# Patient Record
Sex: Female | Born: 1935 | Race: White | Hispanic: No | Smoking: Never smoker
Health system: Southern US, Community
[De-identification: ages and names within clinical notes are randomized; demographics above are authoritative.]

## PROBLEM LIST (undated history)

## (undated) DIAGNOSIS — I4729 Other ventricular tachycardia: Secondary | ICD-10-CM

## (undated) DIAGNOSIS — K219 Gastro-esophageal reflux disease without esophagitis: Secondary | ICD-10-CM

## (undated) DIAGNOSIS — R9431 Abnormal electrocardiogram [ECG] [EKG]: Secondary | ICD-10-CM

## (undated) DIAGNOSIS — I472 Ventricular tachycardia: Secondary | ICD-10-CM

## (undated) DIAGNOSIS — I4510 Unspecified right bundle-branch block: Secondary | ICD-10-CM

## (undated) DIAGNOSIS — M810 Age-related osteoporosis without current pathological fracture: Secondary | ICD-10-CM

## (undated) DIAGNOSIS — I340 Nonrheumatic mitral (valve) insufficiency: Secondary | ICD-10-CM

## (undated) DIAGNOSIS — I10 Essential (primary) hypertension: Secondary | ICD-10-CM

## (undated) DIAGNOSIS — E785 Hyperlipidemia, unspecified: Secondary | ICD-10-CM

## (undated) DIAGNOSIS — E039 Hypothyroidism, unspecified: Secondary | ICD-10-CM

## (undated) HISTORY — DX: Gastro-esophageal reflux disease without esophagitis: K21.9

## (undated) HISTORY — DX: Hypothyroidism, unspecified: E03.9

## (undated) HISTORY — DX: Hyperlipidemia, unspecified: E78.5

## (undated) HISTORY — DX: Age-related osteoporosis without current pathological fracture: M81.0

## (undated) HISTORY — DX: Essential (primary) hypertension: I10

## (undated) HISTORY — DX: Unspecified right bundle-branch block: I45.10

## (undated) HISTORY — DX: Nonrheumatic mitral (valve) insufficiency: I34.0

## (undated) HISTORY — DX: Ventricular tachycardia: I47.2

## (undated) HISTORY — DX: Other ventricular tachycardia: I47.29

## (undated) HISTORY — DX: Abnormal electrocardiogram (ECG) (EKG): R94.31

---

## 1998-10-21 ENCOUNTER — Other Ambulatory Visit: Admission: RE | Admit: 1998-10-21 | Discharge: 1998-10-21 | Payer: Self-pay | Admitting: Obstetrics and Gynecology

## 1999-10-07 ENCOUNTER — Other Ambulatory Visit: Admission: RE | Admit: 1999-10-07 | Discharge: 1999-10-07 | Payer: Self-pay | Admitting: Obstetrics & Gynecology

## 1999-12-29 ENCOUNTER — Encounter: Payer: Self-pay | Admitting: *Deleted

## 1999-12-29 ENCOUNTER — Encounter: Admission: RE | Admit: 1999-12-29 | Discharge: 1999-12-29 | Payer: Self-pay | Admitting: *Deleted

## 2000-12-08 ENCOUNTER — Other Ambulatory Visit: Admission: RE | Admit: 2000-12-08 | Discharge: 2000-12-08 | Payer: Self-pay | Admitting: Obstetrics & Gynecology

## 2001-01-01 ENCOUNTER — Encounter: Payer: Self-pay | Admitting: Internal Medicine

## 2001-01-01 ENCOUNTER — Encounter: Admission: RE | Admit: 2001-01-01 | Discharge: 2001-01-01 | Payer: Self-pay | Admitting: Internal Medicine

## 2001-04-16 ENCOUNTER — Ambulatory Visit (HOSPITAL_COMMUNITY): Admission: RE | Admit: 2001-04-16 | Discharge: 2001-04-16 | Payer: Self-pay | Admitting: Cardiology

## 2001-07-13 ENCOUNTER — Encounter: Payer: Self-pay | Admitting: Orthopedic Surgery

## 2001-07-13 ENCOUNTER — Encounter: Admission: RE | Admit: 2001-07-13 | Discharge: 2001-07-13 | Payer: Self-pay | Admitting: Orthopedic Surgery

## 2001-07-19 ENCOUNTER — Encounter: Payer: Self-pay | Admitting: Orthopedic Surgery

## 2001-07-25 ENCOUNTER — Encounter: Payer: Self-pay | Admitting: Orthopedic Surgery

## 2001-07-25 ENCOUNTER — Inpatient Hospital Stay (HOSPITAL_COMMUNITY): Admission: RE | Admit: 2001-07-25 | Discharge: 2001-07-30 | Payer: Self-pay | Admitting: Orthopedic Surgery

## 2001-12-05 ENCOUNTER — Other Ambulatory Visit: Admission: RE | Admit: 2001-12-05 | Discharge: 2001-12-05 | Payer: Self-pay | Admitting: Obstetrics & Gynecology

## 2002-01-14 ENCOUNTER — Encounter: Payer: Self-pay | Admitting: Obstetrics & Gynecology

## 2002-01-14 ENCOUNTER — Encounter: Admission: RE | Admit: 2002-01-14 | Discharge: 2002-01-14 | Payer: Self-pay | Admitting: Obstetrics & Gynecology

## 2002-05-08 ENCOUNTER — Encounter: Admission: RE | Admit: 2002-05-08 | Discharge: 2002-05-08 | Payer: Self-pay | Admitting: Orthopedic Surgery

## 2002-05-08 ENCOUNTER — Encounter: Payer: Self-pay | Admitting: Orthopedic Surgery

## 2002-07-03 ENCOUNTER — Inpatient Hospital Stay (HOSPITAL_COMMUNITY): Admission: AD | Admit: 2002-07-03 | Discharge: 2002-07-09 | Payer: Self-pay | Admitting: Cardiology

## 2002-12-05 ENCOUNTER — Other Ambulatory Visit: Admission: RE | Admit: 2002-12-05 | Discharge: 2002-12-05 | Payer: Self-pay | Admitting: Obstetrics & Gynecology

## 2002-12-19 ENCOUNTER — Other Ambulatory Visit: Admission: RE | Admit: 2002-12-19 | Discharge: 2002-12-19 | Payer: Self-pay | Admitting: Obstetrics & Gynecology

## 2003-01-16 ENCOUNTER — Encounter: Admission: RE | Admit: 2003-01-16 | Discharge: 2003-01-16 | Payer: Self-pay | Admitting: Obstetrics & Gynecology

## 2003-01-16 ENCOUNTER — Encounter: Payer: Self-pay | Admitting: Obstetrics & Gynecology

## 2004-01-29 ENCOUNTER — Encounter: Admission: RE | Admit: 2004-01-29 | Discharge: 2004-01-29 | Payer: Self-pay | Admitting: Obstetrics & Gynecology

## 2005-02-03 ENCOUNTER — Encounter: Admission: RE | Admit: 2005-02-03 | Discharge: 2005-02-03 | Payer: Self-pay | Admitting: Internal Medicine

## 2006-02-07 ENCOUNTER — Encounter: Admission: RE | Admit: 2006-02-07 | Discharge: 2006-02-07 | Payer: Self-pay | Admitting: Internal Medicine

## 2007-03-21 ENCOUNTER — Encounter: Admission: RE | Admit: 2007-03-21 | Discharge: 2007-03-21 | Payer: Self-pay | Admitting: Internal Medicine

## 2008-03-26 ENCOUNTER — Encounter: Admission: RE | Admit: 2008-03-26 | Discharge: 2008-03-26 | Payer: Self-pay | Admitting: Internal Medicine

## 2009-03-27 ENCOUNTER — Encounter: Admission: RE | Admit: 2009-03-27 | Discharge: 2009-03-27 | Payer: Self-pay | Admitting: Internal Medicine

## 2010-04-02 ENCOUNTER — Encounter: Admission: RE | Admit: 2010-04-02 | Discharge: 2010-04-02 | Payer: Self-pay | Admitting: Internal Medicine

## 2010-11-21 ENCOUNTER — Encounter: Payer: Self-pay | Admitting: Internal Medicine

## 2011-03-18 NOTE — Op Note (Signed)
[place]. [facility]  Patient:    Whitney Russell, Whitney Russell Visit Number: [id number] MRN: [id number]          Service Type: SUR Location: 5000 [id number] [id number] Attending Physician:  Twana First Dictated by:   Georgena Spurling, M.D. Proc. Date: 07/25/01 Admit Date:  07/25/2001                             Operative Report  SURGEON:  Georgena Spurling, M.D.  ASSISTANT:  Elana Alm. Thurston Hole, M.D.  ANESTHESIA:  General endotracheal.  INDICATION FOR PROCEDURE:  The patient is a 77 year old white female with a significant history of a fall and fracture with osteomyelitis and deformed distal femur approximately 40 years ago.  She had dimpling and skin adhered down to the medial aspect of the thigh all the way down to the femur.  This required consultation of a fellowship trained joint replacement surgeon (myself) and a plastic surgeon for wound closure, and therefore deserves a modified 22.  Three surgeons took part in the case, as well as one physician assistant.  DESCRIPTION OF PROCEDURE:  The patient was laid supine on the operating room table and administered general endotracheal anesthesia.  Foley catheter was placed and the right lower extremity was prepped and draped in the usual sterile fashion.  We then made an incision using a #10 blade along her old incision medially and then extended it over to the anterior surface of the knee down below the tibial tubercle.  We also extended it approximately 3 cm proximal to the old incision.  We then developed a large flap in the plane between the subcutaneous tissue and the quadriceps muscle.  We then performed a subvastus approach to the knee, where we went from the medial border of the patella subperiosteally down along the medial border of the patella to the tibial tubercle and developed a subperiosteal sleeve off of the medial crest of the tibia all the way around to the semimembranosus tendon.  Once we had freed up both  adhered skin edges off of the femur and developed flaps, we could easily evert the patella.  We measured it at 22 mm, used a 29-mm reamer to ream down to 13 mm, used a 29 template to drill three lug holes.  There was a very, very large cyst encompassing approximately 60% of the cancellous bone in the patella, which we removed at that time.  There was a good cortical shell and some lateral cancellous bone as well.  We then went into flexion and cut our ACL/PCL so we sublux it anteriorly and used our external alignment guide to make a cut taking off 2 mm off the medial compartment and this cut was 90 degrees to the long axis of the tibia.  We then turned our attention to the femur, where we made a shallow intramedullary hole, and then placed the short stubby intramedullary guide into the femur, tamped down to the distal aspect of the femur, placed our 3-degree cutting block in place, pinned through one of the 0 holes to get our depth of resection appropriate. We then removed the intramedullary guide, went into extension, used a C-arm, and used the extramedullary alignment rods to find the mechanical access from the center of the hip to the center of the ankle.  We then pinned the distal cutting jig into place at this point and went back into flexion and cut our distal femur.  We then placed a 10-mm spacer block in the extension gap, placed our external alignment guide to ensure that we were aligned from mechanical axis.  This was was in fact the case.  It did require a little bit more medial releasing of the pes anserine tendons off of the medial crest of the tibia.  We then went into flexion and marked her upper condylar access. Posterior condylar angle measured 0 degrees.  We placed the sizer block on to a size D, pins in the 3-degree external rotation slot, and then placed our 4-in-1 cutter onto the femur, screwed it into place, made our anterior, posterior, and chamfer cuts.  We then  removed the 4-in-1 cutter, placed in a lamina spreader in the lateral compartment, removed the medial meniscus, the ACL, PCL, and posterior medial osteophytes.  We then placed the lamina spreader in the medial compartment and removed the lateral meniscus and the posterior osteophytes.  We then stripped the capsule off posteriorly.  We then used the finishing block to finish our femur and condylar notch. We then turned our attention to the tibia, where we first measured our flexion-extension gaps.  A size 10 spacer worked very well in flexion and extension.  We did have maybe a 2-degree flexion contracture, which we felt was due more to the anterior bow of the femur than it was to true contracture of the soft tissue.  We then finished the tibia with a size 2 tray, drill, and keel.  We then trialed with a size 2 tibia size D femur, 10 insert, 29-mm patella.  The patella tracked excellently.  It had excellent soft tissue balancing in flexion and extension.  We then removed our components.  We did find a couple of cysts on the lateral femoral condyle and one on the lateral portion of the tibial plateau.  We debrided those with a curette, irrigated copiously, and then we cemented in the tibia first, femur second, patella third, with a trial 10-mm insert in place.  Once it had hardened in extension, we let the tourniquet down and replaced our trial with a 10-mm insert.  At this point, we closed the arthrotomy up to the superior pole of the patella and at that point, where it was adhered to bone, and at this point Dr. Etter Sjogren, plastic surgeon, came in and helped [place] develop flaps and gain closure. He will dictate this portion of the case, but basically he freed up some muscle posterior to the septum to bring anteriorly to close to the rectus femoris, and then we closed our deep soft tissues with 0 Vicryl and subcuticular 2-0 Vicryl.  He revised the scar and then we closed out with 2-0 Vicryl  and skin staples.  We dressed with Adaptic, 4 x 4s, sterile Webril, and Ace wrap.  The patient tolerated the procedure well.  COMPLICATIONS:  None.  DRAINS:  One Hemovac.   TOURNIQUET TIME:  1 hour and 53 minutes.  Again, this operation warrants a modified 22, based on the fact that it used three surgeons, including fellowship training, because of the complexity of the case due to the old deformity and the skin contracture. Dictated by:   Georgena Spurling, M.D. Attending Physician:  Twana First DD:  07/25/01 TD:  07/25/01 Job: [id number]

## 2011-03-18 NOTE — Consult Note (Signed)
NAME:  Whitney Russell, Whitney Russell                          ACCOUNT NO.:  [id number]   MEDICAL RECORD NO.:  [id number]                   PATIENT TYPE:  INP   LOCATION:  4743                                 FACILITY:  [facility]   PHYSICIAN:  Duke Salvia, M.D. [facility]           DATE OF BIRTH:  01/11/1925   DATE OF CONSULTATION:  07/03/2002  DATE OF DISCHARGE:                                   CONSULTATION   Thank you very much for asking me to see this patient in  electrophysiological consultation for wide complex tachycardia and syncope.   HISTORY OF PRESENT ILLNESS:  The patient is a 77 year old woman with a  history for about one and a half years of recurrent episodes of  lightheadedness which were subsequently correlated with nonsustained  ventricular tachycardia.  This was treated with Toprol which essentially  extinguished her symptoms.  Evaluation of her heart included an MRI that was  normal, a Cardiolite that was normal and an echo that was unrevealing.   Because of recent problems with bradycardia, her Toprol dose was decreased.  In the recent context of significant stress at home, she had an episode of  syncope that was unassociated with palpitations.  She was given an event  recorder and the next day went to the beach. The event recorder was notable  for multiple episodes of nonsustained ventricular tachycardia with cycle  length approaching 240 to 280 msec.  Notably also, the morphology of the  ventricular tachycardia was not cleanly monomorphic with some significant  changes in amplitude and morphology, although the basic impression is one of  monomorphic VT.  Because of this, she was referred to the local hospital  where she sat for eight days waiting for an electrophysiological study  following a catheterization that was normal.  However, the equipment broke  and she was then transferred back to [facility].  Her  beta-blockers were stopped in hospital in  anticipation of the procedure. She  is off them currently.   PAST MEDICAL HISTORY:  This is notable for hypertension, hypothyroidism  treated, history of GE reflux disease, history of degenerative joint disease  with a total knee replacement. She has osteopenia and history of some renal  failure post pregnancy.   PAST SURGICAL HISTORY:  This is notable for hysterectomy, knee replacement  as noted previously.   MEDICATIONS:  These include Norvasc, Levothroid 50 mcg, aspirin, Colace,  Fosamax 70 mg a week, and Toprol recently discontinued.   ALLERGIES:  She is allergic to DIAZIDE.   SOCIAL HISTORY:  She is married.  She has two children. She does not smoke  and she drinks but not to excess.   PHYSICAL EXAMINATION:  GENERAL APPEARANCE:  She is an elderly Caucasian  female in no acute distress.  VITAL SIGNS:  Blood pressure 174/72, pulse 56.  HEENT:  No scleral icterus.  No xanthomata.  NECK:  Her neck veins were flat.  Her carotids were brisk bilaterally  without bruits.  BACK:  Without kyphosis or scoliosis.  LUNGS:  Clear.  CARDIOVASCULAR:  Heart sounds were regular without murmurs or gallops. There  was an S4.  ABDOMEN:  Soft with active bowel sounds without midline pulsation or  hepatomegaly.  EXTREMITIES:  Femoral pulses were 2+ bilaterally.  Distal pulses were  intact.  There was no clubbing, cyanosis, or edema.  NEUROLOGIC:  Grossly normal examination.   Electrocardiogram from [facility] demonstrated sinus rhythm at 63  with intervals of 0.16/0.08/0.40.  The axis was normal.  Electrocardiogram  from June 26, 2002, demonstrated three PVCs that appeared to have a left  bundle branch block inferior axis morphology with a [employer] pattern.  The QRS duration in lead V1 was 110 msec.  Transtelephonic monitoring is  notable as reported previously.   IMPRESSION:  1. Wide complex ventricular tachycardia with a left bundle branch block     inferior axis  consistent with an RVOT origin that was sustained with     cycle lengths of 240 to 280 msec.  It is not clearly monomorphic though     it has an appearance suggestive of a monomorphic ventricular tachycardia.  2. Cardiac evaluations included:     a. A negative MRI at [facility].     b. A negative catheterization at [facility].     c. A negative echo, negative Cardiolite at [place].  3. Syncope and repeated presyncope associated with #1.  4. Hypertension.  5. Some degree of bradycardia on Toprol.  6. Electrocardiogram with T-wave flattening in V1 to V3 and a QRS duration     in lead V1 of 110 msec.   DISCUSSION:  The patient has ventricular tachycardia apparently originating  from the right ventricular outflow tract.  The major differential is whether  this is a rhythmogenic right ventricular dysplasia or whether it is RVOT VT.  Against the latter would include her age of onset, the fact that it is not  cleanly monomorphic, the fact that the QRS duration in V1 is somewhat long  at greater than 110 msec and the T-wave abnormalities in V1 to V3.  Against  ARVD as a diagnosis is a negative MRI and repetitive nature of her rhythm.  EP testing looking at the mechanism of the tachycardia is potentially  helpful in clarifying this.   However, from a therapeutic point of view, in the evening that it is RVOT  VT, the patient would like to undergo RF catheter ablation. She is aware of  the potential risks including cardiac perforation and risk of catastrophe in  the range of 1 in 500.  She is also aware that if in the event that the  diagnosis of ARVD is supported, that an ICD and medication, probably  Sotalol, would be the right therapeutic undertaking.   As it relates to RF catheter ablation, I have suggested that ESI mapping may  improve our success and decrease the risks of the procedure and she is agreeable to waiting for [place] to be able to do this in the next 7-10  days.   RECOMMENDATIONS:  Based on the above, therefore:  1. Undertake a signal-averaged ECG to help clarify the diagnosis.  2. Obtain a 12-lead rhythm strip to help clarify the morphology.  3.     Resume her Toprol.  4. Anticipate discharge in the next 24 to 36 hours with outpatient  scheduling of an RF ablation.   Thank you for this consultation.                                                Duke Salvia, M.D. [facility]    SCK/MEDQ  D:  07/03/2002  T:  07/05/2002  Job:  [id number]   cc:   Electrophysiology Lab   Traci R. Mayford Knife, M.D.  [address]  [place]  [place] [address]  Fax: [phone]   Thora Lance, M.D.

## 2011-03-18 NOTE — Procedures (Signed)
[place]. [facility]  Patient:    Whitney Russell, Whitney Russell Visit Number: [id number] MRN: [id number]          Service Type: SUR Location: 5000 5007 01 Attending Physician:  Twana First Dictated by:   Edwin Cap. Zoila Shutter, M.D. Proc. Date: 07/25/01 Admit Date:  07/25/2001                             Procedure Report  DATE OF BIRTH:  1925-05-12.  PROCEDURE PERFORMED:  ANESTHESIOLOGIST:  Edwin Cap. Zoila Shutter, M.D.  INDICATIONS FOR PROCEDURE:  The patient is a 77 year old white female with a diagnosis of severe degenerative disease of the knee scheduled for a total knee replacement and for whom epidural analgesia in the postoperative period had been requested, explained, understood and accepted preoperatively.  DESCRIPTION OF PROCEDURE:  At the termination of surgery while still under general anesthesia, the patient was turned to the right lateral decubitus position.  Her back was prepped with Betadine and draped in the usual sterile fashion.  Using midline approach and loss of resistance technique, peridural tap was accomplished at the L1-L2 interspace with a 17 gauge Tuohy needle. This was followed by the infusion of 10 cc of 100 mcg of fentanyl and 0.5% lidocaine which was followed in turn by the passage of the peridural catheter 10 cm cephalad.  The catheter was then checked for patency and affixed to the patients back.  The patient was then returned to the supine position, awakened and brought to the post anesthesia care unit where her peridural catheter will be connected to an infusion pump with a mixture of fentanyl 5 mcg per cc and Marcaine 1/16% at an initial rate of 10 cc per hour to be adjusted as needed.  There were no complications.  She did well and will be followed in the usual fashion. Dictated by:   Edwin Cap. Zoila Shutter, M.D. Attending Physician:  Twana First DD:  07/25/01 TD:  07/25/01 Job: [id number]

## 2011-03-18 NOTE — Discharge Summary (Signed)
NAME:  Whitney Russell, Whitney Russell                          ACCOUNT NO.:  [id number]   MEDICAL RECORD NO.:  [id number]                   PATIENT TYPE:  INP   LOCATION:  2115                                 FACILITY:  [facility]   PHYSICIAN:  Armanda Magic, M.D.                  DATE OF BIRTH:  10-21-1925   DATE OF ADMISSION:  07/03/2002  DATE OF DISCHARGE:  07/09/2002                                 DISCHARGE SUMMARY   PRIMARY CARE PHYSICIAN:  Thora Lance, M.D.   CONSULTANTS:  Drs. Doylene Canning. Ladona Ridgel and Duke Salvia, [place] [facility].   PROCEDURES:  (July 08, 2002) Invasive electrophysiological study with  electro-anatomical mapping and isoproterenol infusion.  Identified source of  ventricular tachycardia as related to the right ventricular outflow tract  and not arrhythmogenic right ventricular dysplasia.  Radiofrequency energy  applied with successful elimination, spontaneous and induced ventricular  tachycardia (Dr. Duke Salvia).   DISCHARGE DIAGNOSES:  1. Right ventricular outflow tract ventricular tachycardia.     a. (Dr. Graciela Husbands) Electrophysiological testing revealing right ventricular        outflow tract tachycardia.  Subsequent successful radiofrequency        ablation.  2. Syncope related to #1.   PLAN:  1. The patient is discharged home in stable condition.  2. Discharge medications:     a. Norvasc 5 mg one p.o. every day.     b. Colace 200 mg one p.o. every day or p.r.n.     c. Fosamax 70 mg p.o. every day.     d. Aspirin 81 mg per day.     e. Synthroid 50 mcg p.o. every day.     f. Toprol-XL 50 mg p.o. every day (take two tabs of 25 mg per day).     g. Lasix 20 mg one p.o. every day.     h. K-Dur 20 mEq p.o. every day.  3. Activity:  No driving for two weeks.  4. Diet:  As before.  5. Followup:  Dr. Armanda Magic in two weeks; patient to call and arrange for     followup office visit.   HISTORY OF PRESENT ILLNESS:  The patient is a pleasant 77 year old female  with  longstanding history of palpitations managed with beta blocker.  She  had been having increasing frequency of palpitations and was noted to have a  near-syncopal syndrome.  She was prescribed a 30-day event monitor.  She  experienced syncope and was diagnosed with ventricular tachycardia.  She  experienced recurrent episode while vacationing at the beach at [place],  [facility] and was admitted to [facility] under  the care of Dr. Sharilyn Sites.  During the admission, she ruled out for  MI by negative serial cardiac enzymes.  Cardiac catheterization revealed  normal LV function and essentially normal coronary arteries.  EKG review,  Dr. Allena Katz, with  suspicion of right ventricular outflow tract origin of  ventricular tachycardia.  Plans had been made for EPS while in [place]  but scheduling delay/equipment malfunctioning led to excessive delay per  patient tolerance and she requested transfer to [facility] for further  evaluation and therapy.  She was transferred from [facility] to [facility] on July 03, 2002.  Prior 2-D  echocardiogram, January 2003, revealed mild MR, otherwise, okay.  An MRI at  [facility] was negative.  Dr. Nathen May was consulted from  electrophysiology services and he recommended EPS with possible  radiofrequency ablation.  While awaiting this test to be scheduled, the  patient did have prolonged ventricular tachycardia without syncope but felt  lightheaded.  She was transferred to coronary care unit for further  monitoring.  She was hemodynamically stable.  On July 08, 2002 (Dr.  Sherryl Manges), she underwent invasive electrophysiological study with  electro-anatomical mapping and isoproterenol infusion supporting preliminary  diagnosis of ventricular tachycardia originating from the right ventricular  outflow tract, consistent with right ventricular outflow tract --   ventricular tachycardia, in that:  a.  the MRI was normal, b.  the  electrocardiogram was normal, c.  it was not inducible with programmed  stimulation.  Underwent successful radiofrequency ablation, successfully  eliminating spontaneous and induced ventricular tachycardia.  The patient  tolerated the procedure well.   Subsequent, next day or two, the patient was without further ventricular  ectopic activity.  Dr. Graciela Husbands recommended endocarditis prophylaxis for 6  weeks and no driving for 2 to 4 weeks; also recommended aspirin once a day  for approximately 12 weeks.  The patient was discharged home in stable  condition.   PREVIOUS MEDICAL HISTORY:  1. History of hypertension, good control on current medical regimen.  2. Episodic shortness of breath probably related to ventricular tachycardia.  3. Hypothyroidism, on Synthroid.  4. History of GERD.  5. Left olecranon bursitis, 2000.  6. History of DJD, status post right total knee replacement, Dr. Elana Alm.     [place].  7. History of right femur osteomyelitis as a child.  8. History of epistaxis requiring cauterization in the past.  9. Osteopenia, on Fosamax.  10.      History of renal failure with past pregnancy, with subsequent     hysterectomy and appendectomy.   LABORATORY TESTS AND DATA:  On July 08, 2002, WBC 6.3, hemoglobin 12.7,  hematocrit of 37.5, platelets of 224,000; pro time of 13.1, INR of 0.9, PTT  31; sodium 143, potassium of 4.1, chloride of 105, CO2 28, glucose 87, BUN  17, creatinine 0.9, calcium 9.5, magnesium 2.2.     Salomon Fick, N.P.                       Armanda Magic, M.D.    MES/MEDQ  D:  08/27/2002  T:  08/28/2002  Job:  [id number]   cc:   Thora Lance, M.D.  [address] [place]  [place] [address]  Fax: [phone]   [facility], [place], [place] Allena Katz, Cathlyn Parsons M.D.   Doylene Canning. Ladona Ridgel, M.D. [facility]   Duke Salvia, M.D. [facility]

## 2011-03-18 NOTE — Op Note (Signed)
NAME:  Whitney Russell, Whitney Russell                          ACCOUNT NO.:  [id number]   MEDICAL RECORD NO.:  [id number]                   PATIENT TYPE:  INP   LOCATION:  2115                                 FACILITY:  [facility]   PHYSICIAN:  Duke Salvia, M.D. [facility]           DATE OF BIRTH:  [birth year]   DATE OF PROCEDURE:  07/08/2002  DATE OF DISCHARGE:                                 OPERATIVE REPORT   OPTICAL DISK 200.   PREOPERATIVE DIAGNOSIS:  Ventricular tachycardia from the right ventricular  outflow tract.   POSTOPERATIVE DIAGNOSIS:  Ventricular tachycardia from the right ventricular  outflow tract.   PROCEDURE:  Invasive electrophysiological study with electroanatomical  mapping and isoproterenol infusion.   DESCRIPTION OF PROCEDURE:  Following the obtaining of informed consent, the  patient was brought to the electrophysiology laboratory and placed on the  fluoroscopic table in supine position.  After routine prep and drape,  cardiac catheterization was performed with local anesthesia and minimal  sedation.  Noninasive blood pressure monitoring and transcutaneous oxygen  saturation monitoring were performed continuously throughout the procedure.  Following the procedure the catheter was removed, hemostasis was obtained,  and the patient was transferred to the floor in stable condition.   CATHETERS:  A 5 French quadripolar catheter was inserted via the left  femoral vein to the RV apex.  A 5 French quadripolar catheter was inserted  via the left femoral vein to the high right atrium and subsequently changed  out for a 6 [place] hexapolar catheter.  A 9 French ESI mapping array was  inserted via the left femoral vein to the right ventricular outflow tract  via a wire in the left pulmonary artery.  A 7 French, 4 mm deflectable-tip  catheter was inserted via the right femoral vein to mapping sites in the  right ventricular outflow tract.   Surface leads I, aVF, and V1 were monitored  continuously throughout the  procedure.  Following the insertion of the catheters, the stimulation  protocol included incremental atrial pacing, incremental ventricular pacing,  burst ventricular and burst atrial pacing, single and double atrial  extrastimuli at a paced cycle length of 500 msec, single and double  ventricular extrastimuli from the right ventricular apex at a paced cycle  length of 400 msec.   RESULTS:  SURFACE ELECTROCARDIOGRAM:  Rhythm:  Sinus.  Cycle length:  970 msec (initial), 827 msec (final).  PR interval:  457 msec (initial), 167 msec (final).  QRS duration:  80 msec (initial), 153 msec (final).  QT interval:  465 msec (initial), 471 msec (final).  P-wave duration:  120 msec (initial), 122 msec (final).  Bundle branch block:  Absent (initial), present-right (final).  Pre-excitation:  Absent (initial), (absent (final).   AV NODAL FUNCTION:  The AV Wenckebach cycle length was 315 msec in the absence of isoproterenol,  and VA conduction was dissociated.  AH interval:  94 msec (initial), 83  msec (final).  There was no evidence of dual AV nodal physiology.  AV conduction was continuous.   HIS-PURKINJE SYSTEM FUNCTION:  HV interval:  63 msec (initial), 53 msec (final).   ACCESSORY PATHWAY:  No evidence of any accessory pathway was identified.   VENTRICULAR RESPONSE TO PROGRAMMED STIMULATION:  Ventricular tachycardia  could not be induced by programmed stimulation.  It occurred episodically,  particularly in the presence of higher-dose isoproterenol.  Doses of higher  than 4 mcg/min. were associated with hypotension and had to be discontinued.   Ventricular tachycardia with right bundle branch block and a left bundle  branch block anterior axis morphology was mapped to the posterior septal  aspect of the right ventricular outflow tract.  Electrical activation at  these sites was earlier than -30 msec, although with the [facility] array it was a  little bit difficult  to discern this number.  Mapping via the array  identified early activation as well as the breakout point, and lesions were  placed at the site of breakout and drawn back to the site of earliest  activation, of which there were, in fact, two.  Following this, programmed  stimulation and observation failed to identify further ventricular ectopy  from this site.   FLUOROSCOPY TIME:  A total of 25 minutes of fluoroscopy time was utilized at  15 frames per second.  A total of 10 minutes 20 seconds of radiofrequency  energy was applied in 18 lesions at sites identified above.  As noted,  following the application of these sites, spontaneously induced ventricular  ectopy was not seen.   A second arrhythmia was identified with short runs of an atrial tachycardia  that was nonsustained in the presence of isoproterenol.  It appeared by to P-  wave mapping suggest a sinus node re-entry.  It was not further mapped.   IMPRESSION:  1. Normal sinus function, question sinus node re-entry.  2. Normal atrial function with an atrial tachycardia, question #1.  3. Normal AV nodal function.  4. Normal His-Purkinje system function with reduction of right bundle branch     block with catheter manipulation.  5. No accessory pathway.  6. Ventricular tachycardia originating from the right ventricular outflow     tract, consistent with right ventricular outflow tract-ventricular     tachycardia in that:  (a) the MRI was normal; (b) the electrocardiogram     was normal; (c) it was not inducible with programmed stimulation.     Radiofrequency energy applied from sites of earliest activation to     breakout.  Earliest activation sites one and three and drawn to breakout     successfully, eliminating ventricular ectopy both spontaneously as well     as induced in the presence and in the absence of isoproterenol.  SUMMARY AND CONCLUSION:  The results of electrophysiological testing support  the hypothesis that the  patient's ventricular tachycardia was indeed related  to the right ventricular outflow tract and not arrhythmogenic RV dysplasia  despite the unusual age and the somewhat polymorphic appearance.  Radiofrequency energy applied at sites of earliest activation and drawn to  the site of breakout, successfully eliminating spontaneous and induced  ventricular tachycardia.   We will continue her on low-dose Toprol for about two weeks' time,  endocarditis prophylaxis for six weeks, and aspirin daily for six weeks.  Duke Salvia, M.D. [facility]    SCK/MEDQ  D:  07/08/2002  T:  07/08/2002  Job:  [id number]   cc:   Thora Lance, M.D.   Traci R. Mayford Knife, M.D.  [address], [address]  [place]  [place] [address]  Fax: [phone]   Electrophysiology Laboratory

## 2011-03-18 NOTE — H&P (Signed)
NAME:  Whitney Russell, Whitney Russell                          ACCOUNT NO.:  [id number]   MEDICAL RECORD NO.:  [id number]                   PATIENT TYPE:  INP   LOCATION:  2115                                 FACILITY:  [facility]   PHYSICIAN:  Meade Maw, M.D.                 DATE OF BIRTH:  Oct 19, 1925   DATE OF ADMISSION:  07/03/2002  DATE OF DISCHARGE:  07/09/2002                                HISTORY & PHYSICAL   PRIMARY CARE CARDIOLOGIST:  Armanda Magic, M.D.   PROBLEM LIST:  1. Near syncope secondary to ventricular tachycardia.  2. Ventricular tachycardia initially identified on event monitor.  The     patient was visiting beach area at [place], [place] when event recordings     obtained.  So, presented to [facility] with     subsequent evaluation as Dr. Allena Katz, which included:     a. Rule out for myocardial infarction by negative serial cardiac enzymes.     b. Cardiac catheterization revealing normal left ventricular function,        essentially normal coronary arteries.     c. EKG revealed suspected right ventricular outflow track origin of        ventricular tachycardia.  Planned to of had electrophysiology study, but scheduling delay/equipment  malfunction, lead to excessive delay.  The patient requested transfer to  [facility] for further evaluation and therapy.  Prior 2-D  echocardiogram in 1/03, revealed mitral regurgitation, mild aortic  insufficiency, normal left ventricular function with ejection fraction of  73%.  1. History of hypertension, good control on current medical regimen.  2. Episodic shortness of breath probably secondary to #2.  3. Hypothyroidism, on Synthroid.  4. History of gastroesophageal reflux disease.  5. History of left olecranon bursitis in 2000.  6. History of degenerative joint disease, status post right total knee     replacement by Dr. Thurston Hole.  7. History of right femur osteomyelitis as a child.  8. History of epistaxis,  resulting in need for cauterization in the past.  9. Osteopenia for which she is on Fosamax.  10.      History of renal failure, post pregnancy, with subsequent     hysterectomy and appendectomy.   PLAN:  (As dictated by Dr. Meade Maw).  The patient accepted in transfer from [facility] for further  evaluation of syncope and sustained ventricular tachycardia by  electrophysiology team here at [facility] (Drs. Ladona Ridgel or Graciela Husbands).  The patient had  no further arrhythmias while under observation at [facility].  Her physical  evaluation is benign.   HISTORY OF PRESENT ILLNESS:  The patient is a pleasant 77 year old female  with a long-standing history of palpitations, managed with beta blocker  therapy.  She has been complaining of increasing frequency of palpitations,  and was noted to have near syncope syndrome.  She was prescribed a 30 day  event  monitor.  She experienced syncope, and was diagnosed with ventricular  tachycardia on monitor review.  She had been visiting at [facility] at  the time and presented to [facility], and was under  Dr. Cathlyn Parsons Patel's care.  Hospital course included cardiac  catheterization revealing essentially normal coronary arteries, and normal  left ventricular function.  She did rule out for myocardial infarction with  negative serial enzymes.  Dr. Allena Katz suspected right ventricular outflow  track as the origin of the ventricular tachycardia.  She was scheduled to  undergo an electrophysiology study, but scheduling delays and equipment  malfunction lead to delays to the point that the patient wished to transfer  to [facility] for further evaluation and therapy.   ALLERGIES:  DIAZIDE.   TRANSFER MEDICATIONS:  1. Norvasc 5 mg p.o. q.d.  2. Levothroid 0.05 mg q.d.  3. Enteric coated baby aspirin q.d.  4. Colace 200 mg one q.d.  5. Fosamax 70 mg per week.  6. Status post Toprol XL 25 mg evening prior to  admission.   HABITS:  Tobacco:  Negative.  ETOH:  Not excessive.   SOCIAL HISTORY:  The patient is married with two children, one of whom is  adopted.   PHYSICAL EXAMINATION:  (As dictated by Dr. Meade Maw).  VITAL SIGNS:  Blood pressure 174/72, heart rate 56 and regular, respiratory  rate 18, temperature 98.2.  She is 4 feet 11 inches, weighs 120 pounds.  GENERAL:  She is a well-nourished, pleasantly conversant female in no acute  distress.  HEENT:  There are bilateral brisk upstrokes without bruits.  NECK:  No JVD, no thyromegaly.  CHEST:  Lung sounds clear with equal __________  excursion.  CARDIAC:  Regular rate and rhythm without murmurs, rubs, or gallops.  ABDOMEN:  Soft, nondistended, normoactive bowel sounds.  Negative abdominal  aorta, renal or femoral bruit.  EXTREMITIES:  Distal pulses intact, negative pedal edema.  NEUROLOGIC:  Grossly nonfocal.  Alert and oriented x3.  GENITALIA:  Deferred.  RECTAL:  Deferred.    LABORATORY DATA:  From 07/02/02, sodium 140, potassium 3.9, chloride 107, CO2  28, BUN 19, creatinine 1.1, glucose 82.  From 06/28/02, hemoglobin 12.6,  hematocrit 36.3, white blood cell count 6.7, platelets 194.  Differential  okay.  From 06/26/02, cholesterol 259, triglycerides 180, HDL 53, LDL 170.  From 06/25/02, TSH okay at 1.424, SP3, SP4 within normal limits.  Serial  troponin-I's were negative.     Salomon Fick, N.P.                       Meade Maw, M.D.    MES/MEDQ  D:  08/27/2002  T:  08/27/2002  Job:  [id number]   cc:   Armanda Magic, M.D.  [address]  [place], [place] [address]  Fax: [phone]   Thora Lance, M.D.  [address] [place]  [place] [address]  Fax: [phone]   Chase Picket M.D. Creta Levin

## 2011-03-18 NOTE — Op Note (Signed)
[facility]  Patient:    DELIYAH, MUCKLE Visit Number: [id number] MRN: [id number]          Service Type: SUR Location: 5000 5007 01 Attending Physician:  Twana First Dictated by:   Teena Irani. Odis Luster, M.D. Proc. Date: 07/25/01 Admit Date:  07/25/2001                             Operative Report  PREOPERATIVE DIAGNOSIS:  Complicated open wound of the right medial thigh.  POSTOPERATIVE DIAGNOSIS:  Complicated open wound of the right medial thigh greater than 30 cm squared.  OPERATION PERFORMED:  Fasciocutaneous advancement flap closure, wound right medial thigh, greater than 30 cm squared.  SURGEON:  Teena Irani. Odis Luster, M.D.  ASSISTANT: 1. Elana Alm. Thurston Hole, M.D. 2. Georgena Spurling, M.D.  ANESTHESIA:  General.  INDICATIONS FOR PROCEDURE:  The patient is a 77 year old woman who has had previous history of osteomyelitis as a child which was treated with multiple incision and drainage procedures.  Presents for a total knee replacement.  She was seen preoperatively by Plastic surgery for evaluation of possible reconstructive surgery for soft tissue deficit in order to achieve good coverage of the total joint.  Various muscle flaps were discussed with her depending upon the nature of the defect.  It was our hope that a fasciocutaneous advancement flap would suffice.  The nature of the procedure and the risks were discussed with her.  She understood these risks, possibility of wound-healing problems, tissue loss, scarring and wished to proceed.  DESCRIPTION OF PROCEDURE:  The patient was taken to the operating room and the total knee procedure had been completed.  There was open wound and it was felt that a fasciocutaneous advancement flap would suffice.  Freeing the posterior fasciocutaneous advancement flap would also free gracilis muscle and allow it to be transposed over the joint capsule area.  The dissection was carried out just above the  gracilis musculature extending posteriorly back over the adductor magnus.  The fasciocutaneous flap was raised.  The tourniquet was deflated and meticulous hemostasis was achieved using electrocautery and a couple of Ligaclips as needed.  The gracilis muscle was then advanced to join up with the joint capsule for coverage using #2 Vicryl sutures.  The wound was irrigated and excellent hemostasis having been confirmed, the advancement flap was then advanced in the anterior direction.  The Hemovac drain was positioned and brought through a separate stab wound superiorly and laterally.  The closure 0 Vicryl interrupted inverted deep sutures, 2-0 Vicryl interrupted inverted deep dermal sutures and skin staples.  The patient tolerated the procedure well. Dictated by:   Teena Irani. Odis Luster, M.D. Attending Physician:  Twana First DD:  07/25/01 TD:  07/25/01 Job: [id number]

## 2011-03-18 NOTE — Discharge Summary (Signed)
[facility]  Patient:    Whitney Russell, Whitney Russell Visit Number: [id number] MRN: [id number]          Service Type: SUR Location: 5000 5007 01 Attending Physician:  Twana First Dictated by:   Julien Girt, P.A. Admit Date:  07/25/2001 Discharge Date: 07/30/2001                             Discharge Summary  ADMISSION DIAGNOSES: 1. End-stage degenerative joint disease right knee. 2. Hypertension. 3. History of osteomyelitis.  DISCHARGE DIAGNOSES: 1. End-stage degenerative joint disease right knee. 2. History of hypertension. 3. History of osteomyelitis.  HISTORY OF PRESENT ILLNESS:  The patient is a 77 year old white female who has a history of hypertension, osteomyelitis in her right femur. She underwent surgical treatment as a child for osteomyelitis.  She has not had any difficulty with this but does have a deformed femur secondary to her disease.  Risks, benefits and possible complications were discussed for a right total knee replacement. She understands these and is without question.  PROCEDURES IN HOUSE:  On July 25, 2001 the patient underwent a right total knee replacement.  She tolerated the procedure well.  HOSPITAL COURSE:  The patient was admitted postoperatively for pain control, physical therapy and DVT prophylaxis. Postoperative day one, the patient was doing well without complaint.  Epidural was working.  Knee dressing was intact.  Hemoglobin was 10.7. She was metabolically stable.  INR was 1.1. Postoperative day two, the patient still doing well without complaint. Surgical wound is well approximated.  Distal neurovascular exam was intact. Hemoglobin was 9.7.  She still remained metabolically stable.  Postoperative three the patient was progressing well with physical therapy.  Wound was well approximated.  Postoperative day four, hemoglobin was 10.3.  The patient was progressing well.  Postoperative day five, the  patient had range of motion minus 10 to 75 degrees.  Surgical wound was well approximated.  She was metabolically stable.  She was discharged to home in stable condition with home health physical therapy 3 in 1 walker, instructions to keep her wound clean and dry.  She was weight bearing as tolerated, regular diet.  DISCHARGE MEDICATIONS:  1. Percocet one to two q.4-6h. p.r.n. pain.  2. Coumadin 1 mg.  3. Norvasc 5 mg one p.o. q.d.  4. Premarin 0.625 mg one p.o. q.d.  5. K-Dur 20 mEq one p.o. q.d.  6. Synthroid 0.1 mg one p.o. q.d.  7. Toprol 25 mg one p.o. q.d.  8. Lasix 40 mg one p.o. q.d.  9. Fosamax 70 mg one p.o. q.week. 10. Colace 100 mg one p.o. b.i.d.  FOLLOW-UP:  We will see her back in the office when she is two weeks postoperative for suture removal. Dictated by:   Julien Girt, P.A. Attending Physician:  Twana First DD:  08/22/01 TD:  08/24/01 Job: [id number]

## 2011-04-28 ENCOUNTER — Other Ambulatory Visit: Payer: Self-pay | Admitting: Geriatric Medicine

## 2011-04-28 DIAGNOSIS — Z1231 Encounter for screening mammogram for malignant neoplasm of breast: Secondary | ICD-10-CM

## 2011-05-13 ENCOUNTER — Ambulatory Visit
Admission: RE | Admit: 2011-05-13 | Discharge: 2011-05-13 | Disposition: A | Discharge status: Home / Self Care | Payer: Medicare Other | Source: Ambulatory Visit | Attending: Geriatric Medicine | Admitting: Geriatric Medicine

## 2011-05-13 DIAGNOSIS — Z1231 Encounter for screening mammogram for malignant neoplasm of breast: Secondary | ICD-10-CM

## 2011-12-01 DIAGNOSIS — Z961 Presence of intraocular lens: Secondary | ICD-10-CM | POA: Diagnosis not present

## 2011-12-01 DIAGNOSIS — D3130 Benign neoplasm of unspecified choroid: Secondary | ICD-10-CM | POA: Diagnosis not present

## 2012-01-11 DIAGNOSIS — J069 Acute upper respiratory infection, unspecified: Secondary | ICD-10-CM | POA: Diagnosis not present

## 2012-01-16 DIAGNOSIS — J329 Chronic sinusitis, unspecified: Secondary | ICD-10-CM | POA: Diagnosis not present

## 2012-01-18 DIAGNOSIS — R05 Cough: Secondary | ICD-10-CM | POA: Diagnosis not present

## 2012-01-18 DIAGNOSIS — H109 Unspecified conjunctivitis: Secondary | ICD-10-CM | POA: Diagnosis not present

## 2012-01-18 DIAGNOSIS — R197 Diarrhea, unspecified: Secondary | ICD-10-CM | POA: Diagnosis not present

## 2012-02-03 DIAGNOSIS — I495 Sick sinus syndrome: Secondary | ICD-10-CM | POA: Diagnosis not present

## 2012-02-03 DIAGNOSIS — I119 Hypertensive heart disease without heart failure: Secondary | ICD-10-CM | POA: Diagnosis not present

## 2012-02-03 DIAGNOSIS — I472 Ventricular tachycardia: Secondary | ICD-10-CM | POA: Diagnosis not present

## 2012-03-16 DIAGNOSIS — Z1331 Encounter for screening for depression: Secondary | ICD-10-CM | POA: Diagnosis not present

## 2012-03-16 DIAGNOSIS — Z79899 Other long term (current) drug therapy: Secondary | ICD-10-CM | POA: Diagnosis not present

## 2012-03-16 DIAGNOSIS — M949 Disorder of cartilage, unspecified: Secondary | ICD-10-CM | POA: Diagnosis not present

## 2012-03-16 DIAGNOSIS — E039 Hypothyroidism, unspecified: Secondary | ICD-10-CM | POA: Diagnosis not present

## 2012-03-16 DIAGNOSIS — Z0000 Encounter for general adult medical examination without abnormal findings: Secondary | ICD-10-CM | POA: Diagnosis not present

## 2012-03-16 DIAGNOSIS — E782 Mixed hyperlipidemia: Secondary | ICD-10-CM | POA: Diagnosis not present

## 2012-03-16 DIAGNOSIS — I10 Essential (primary) hypertension: Secondary | ICD-10-CM | POA: Diagnosis not present

## 2012-04-05 DIAGNOSIS — M899 Disorder of bone, unspecified: Secondary | ICD-10-CM | POA: Diagnosis not present

## 2012-04-24 ENCOUNTER — Other Ambulatory Visit: Payer: Self-pay | Admitting: Geriatric Medicine

## 2012-04-24 DIAGNOSIS — Z1231 Encounter for screening mammogram for malignant neoplasm of breast: Secondary | ICD-10-CM

## 2012-05-15 DIAGNOSIS — M810 Age-related osteoporosis without current pathological fracture: Secondary | ICD-10-CM | POA: Diagnosis not present

## 2012-05-17 ENCOUNTER — Ambulatory Visit: Payer: Medicare Other

## 2012-05-24 ENCOUNTER — Ambulatory Visit
Admission: RE | Admit: 2012-05-24 | Discharge: 2012-05-24 | Disposition: A | Discharge status: Home / Self Care | Payer: Medicare Other | Source: Ambulatory Visit | Attending: Geriatric Medicine | Admitting: Geriatric Medicine

## 2012-05-24 DIAGNOSIS — Z1231 Encounter for screening mammogram for malignant neoplasm of breast: Secondary | ICD-10-CM

## 2012-07-18 DIAGNOSIS — Z23 Encounter for immunization: Secondary | ICD-10-CM | POA: Diagnosis not present

## 2012-09-18 DIAGNOSIS — I10 Essential (primary) hypertension: Secondary | ICD-10-CM | POA: Diagnosis not present

## 2012-09-18 DIAGNOSIS — E782 Mixed hyperlipidemia: Secondary | ICD-10-CM | POA: Diagnosis not present

## 2012-09-18 DIAGNOSIS — Z79899 Other long term (current) drug therapy: Secondary | ICD-10-CM | POA: Diagnosis not present

## 2013-01-03 DIAGNOSIS — D3130 Benign neoplasm of unspecified choroid: Secondary | ICD-10-CM | POA: Diagnosis not present

## 2013-01-03 DIAGNOSIS — H35369 Drusen (degenerative) of macula, unspecified eye: Secondary | ICD-10-CM | POA: Diagnosis not present

## 2013-01-03 DIAGNOSIS — Z961 Presence of intraocular lens: Secondary | ICD-10-CM | POA: Diagnosis not present

## 2013-01-03 DIAGNOSIS — H2640 Unspecified secondary cataract: Secondary | ICD-10-CM | POA: Diagnosis not present

## 2013-01-10 DIAGNOSIS — H938X9 Other specified disorders of ear, unspecified ear: Secondary | ICD-10-CM | POA: Diagnosis not present

## 2013-01-10 DIAGNOSIS — H6120 Impacted cerumen, unspecified ear: Secondary | ICD-10-CM | POA: Diagnosis not present

## 2013-01-17 DIAGNOSIS — H6120 Impacted cerumen, unspecified ear: Secondary | ICD-10-CM | POA: Diagnosis not present

## 2013-02-01 DIAGNOSIS — I472 Ventricular tachycardia: Secondary | ICD-10-CM | POA: Diagnosis not present

## 2013-02-01 DIAGNOSIS — I119 Hypertensive heart disease without heart failure: Secondary | ICD-10-CM | POA: Diagnosis not present

## 2013-02-01 DIAGNOSIS — I495 Sick sinus syndrome: Secondary | ICD-10-CM | POA: Diagnosis not present

## 2013-03-19 DIAGNOSIS — Z79899 Other long term (current) drug therapy: Secondary | ICD-10-CM | POA: Diagnosis not present

## 2013-03-19 DIAGNOSIS — E039 Hypothyroidism, unspecified: Secondary | ICD-10-CM | POA: Diagnosis not present

## 2013-03-19 DIAGNOSIS — H6120 Impacted cerumen, unspecified ear: Secondary | ICD-10-CM | POA: Diagnosis not present

## 2013-03-19 DIAGNOSIS — E782 Mixed hyperlipidemia: Secondary | ICD-10-CM | POA: Diagnosis not present

## 2013-03-19 DIAGNOSIS — I10 Essential (primary) hypertension: Secondary | ICD-10-CM | POA: Diagnosis not present

## 2013-03-19 DIAGNOSIS — Z0000 Encounter for general adult medical examination without abnormal findings: Secondary | ICD-10-CM | POA: Diagnosis not present

## 2013-03-19 DIAGNOSIS — Z1331 Encounter for screening for depression: Secondary | ICD-10-CM | POA: Diagnosis not present

## 2013-05-08 ENCOUNTER — Other Ambulatory Visit: Payer: Self-pay

## 2013-05-08 DIAGNOSIS — Z1231 Encounter for screening mammogram for malignant neoplasm of breast: Secondary | ICD-10-CM

## 2013-05-31 ENCOUNTER — Ambulatory Visit
Admission: RE | Admit: 2013-05-31 | Discharge: 2013-05-31 | Disposition: A | Discharge status: Home / Self Care | Payer: Medicare Other | Source: Ambulatory Visit

## 2013-05-31 DIAGNOSIS — Z1231 Encounter for screening mammogram for malignant neoplasm of breast: Secondary | ICD-10-CM | POA: Diagnosis not present

## 2013-07-26 DIAGNOSIS — B029 Zoster without complications: Secondary | ICD-10-CM | POA: Diagnosis not present

## 2013-08-06 DIAGNOSIS — B029 Zoster without complications: Secondary | ICD-10-CM | POA: Diagnosis not present

## 2013-08-06 DIAGNOSIS — K5900 Constipation, unspecified: Secondary | ICD-10-CM | POA: Diagnosis not present

## 2013-08-06 DIAGNOSIS — I10 Essential (primary) hypertension: Secondary | ICD-10-CM | POA: Diagnosis not present

## 2013-08-19 DIAGNOSIS — Z23 Encounter for immunization: Secondary | ICD-10-CM | POA: Diagnosis not present

## 2013-08-19 DIAGNOSIS — M259 Joint disorder, unspecified: Secondary | ICD-10-CM | POA: Diagnosis not present

## 2013-08-22 DIAGNOSIS — S43429A Sprain of unspecified rotator cuff capsule, initial encounter: Secondary | ICD-10-CM | POA: Diagnosis not present

## 2013-08-22 DIAGNOSIS — M5030 Other cervical disc degeneration, unspecified cervical region: Secondary | ICD-10-CM | POA: Diagnosis not present

## 2013-08-30 DIAGNOSIS — R279 Unspecified lack of coordination: Secondary | ICD-10-CM | POA: Diagnosis not present

## 2013-08-30 DIAGNOSIS — M6281 Muscle weakness (generalized): Secondary | ICD-10-CM | POA: Diagnosis not present

## 2013-09-02 DIAGNOSIS — M6281 Muscle weakness (generalized): Secondary | ICD-10-CM | POA: Diagnosis not present

## 2013-09-02 DIAGNOSIS — R279 Unspecified lack of coordination: Secondary | ICD-10-CM | POA: Diagnosis not present

## 2013-09-04 DIAGNOSIS — R279 Unspecified lack of coordination: Secondary | ICD-10-CM | POA: Diagnosis not present

## 2013-09-04 DIAGNOSIS — M6281 Muscle weakness (generalized): Secondary | ICD-10-CM | POA: Diagnosis not present

## 2013-09-06 DIAGNOSIS — R279 Unspecified lack of coordination: Secondary | ICD-10-CM | POA: Diagnosis not present

## 2013-09-06 DIAGNOSIS — M6281 Muscle weakness (generalized): Secondary | ICD-10-CM | POA: Diagnosis not present

## 2013-09-09 DIAGNOSIS — R279 Unspecified lack of coordination: Secondary | ICD-10-CM | POA: Diagnosis not present

## 2013-09-09 DIAGNOSIS — M6281 Muscle weakness (generalized): Secondary | ICD-10-CM | POA: Diagnosis not present

## 2013-09-10 DIAGNOSIS — R279 Unspecified lack of coordination: Secondary | ICD-10-CM | POA: Diagnosis not present

## 2013-09-10 DIAGNOSIS — M6281 Muscle weakness (generalized): Secondary | ICD-10-CM | POA: Diagnosis not present

## 2013-09-11 DIAGNOSIS — Z79899 Other long term (current) drug therapy: Secondary | ICD-10-CM | POA: Diagnosis not present

## 2013-09-11 DIAGNOSIS — E039 Hypothyroidism, unspecified: Secondary | ICD-10-CM | POA: Diagnosis not present

## 2013-09-11 DIAGNOSIS — I10 Essential (primary) hypertension: Secondary | ICD-10-CM | POA: Diagnosis not present

## 2013-09-11 DIAGNOSIS — R5381 Other malaise: Secondary | ICD-10-CM | POA: Diagnosis not present

## 2013-09-12 DIAGNOSIS — M6281 Muscle weakness (generalized): Secondary | ICD-10-CM | POA: Diagnosis not present

## 2013-09-12 DIAGNOSIS — R279 Unspecified lack of coordination: Secondary | ICD-10-CM | POA: Diagnosis not present

## 2013-09-13 DIAGNOSIS — M6281 Muscle weakness (generalized): Secondary | ICD-10-CM | POA: Diagnosis not present

## 2013-09-13 DIAGNOSIS — R279 Unspecified lack of coordination: Secondary | ICD-10-CM | POA: Diagnosis not present

## 2013-09-16 DIAGNOSIS — R279 Unspecified lack of coordination: Secondary | ICD-10-CM | POA: Diagnosis not present

## 2013-09-16 DIAGNOSIS — S43429A Sprain of unspecified rotator cuff capsule, initial encounter: Secondary | ICD-10-CM | POA: Diagnosis not present

## 2013-09-16 DIAGNOSIS — M6281 Muscle weakness (generalized): Secondary | ICD-10-CM | POA: Diagnosis not present

## 2013-09-18 DIAGNOSIS — M6281 Muscle weakness (generalized): Secondary | ICD-10-CM | POA: Diagnosis not present

## 2013-09-18 DIAGNOSIS — R279 Unspecified lack of coordination: Secondary | ICD-10-CM | POA: Diagnosis not present

## 2013-09-19 DIAGNOSIS — M6281 Muscle weakness (generalized): Secondary | ICD-10-CM | POA: Diagnosis not present

## 2013-09-19 DIAGNOSIS — R279 Unspecified lack of coordination: Secondary | ICD-10-CM | POA: Diagnosis not present

## 2013-09-20 DIAGNOSIS — R279 Unspecified lack of coordination: Secondary | ICD-10-CM | POA: Diagnosis not present

## 2013-09-20 DIAGNOSIS — M6281 Muscle weakness (generalized): Secondary | ICD-10-CM | POA: Diagnosis not present

## 2013-09-23 DIAGNOSIS — M6281 Muscle weakness (generalized): Secondary | ICD-10-CM | POA: Diagnosis not present

## 2013-09-23 DIAGNOSIS — R279 Unspecified lack of coordination: Secondary | ICD-10-CM | POA: Diagnosis not present

## 2013-09-30 DIAGNOSIS — M19019 Primary osteoarthritis, unspecified shoulder: Secondary | ICD-10-CM | POA: Diagnosis not present

## 2013-09-30 DIAGNOSIS — R279 Unspecified lack of coordination: Secondary | ICD-10-CM | POA: Diagnosis not present

## 2013-09-30 DIAGNOSIS — M25519 Pain in unspecified shoulder: Secondary | ICD-10-CM | POA: Diagnosis not present

## 2013-09-30 DIAGNOSIS — M6281 Muscle weakness (generalized): Secondary | ICD-10-CM | POA: Diagnosis not present

## 2013-10-21 DIAGNOSIS — M19019 Primary osteoarthritis, unspecified shoulder: Secondary | ICD-10-CM | POA: Diagnosis not present

## 2013-11-04 DIAGNOSIS — M25519 Pain in unspecified shoulder: Secondary | ICD-10-CM | POA: Diagnosis not present

## 2013-11-04 DIAGNOSIS — R279 Unspecified lack of coordination: Secondary | ICD-10-CM | POA: Diagnosis not present

## 2013-11-04 DIAGNOSIS — M6281 Muscle weakness (generalized): Secondary | ICD-10-CM | POA: Diagnosis not present

## 2013-11-04 DIAGNOSIS — R609 Edema, unspecified: Secondary | ICD-10-CM | POA: Diagnosis not present

## 2013-11-18 DIAGNOSIS — M19019 Primary osteoarthritis, unspecified shoulder: Secondary | ICD-10-CM | POA: Diagnosis not present

## 2013-11-19 DIAGNOSIS — R609 Edema, unspecified: Secondary | ICD-10-CM | POA: Diagnosis not present

## 2013-11-19 DIAGNOSIS — I10 Essential (primary) hypertension: Secondary | ICD-10-CM | POA: Diagnosis not present

## 2013-12-02 DIAGNOSIS — M25519 Pain in unspecified shoulder: Secondary | ICD-10-CM | POA: Diagnosis not present

## 2013-12-02 DIAGNOSIS — M6281 Muscle weakness (generalized): Secondary | ICD-10-CM | POA: Diagnosis not present

## 2013-12-02 DIAGNOSIS — R279 Unspecified lack of coordination: Secondary | ICD-10-CM | POA: Diagnosis not present

## 2013-12-05 DIAGNOSIS — M6281 Muscle weakness (generalized): Secondary | ICD-10-CM | POA: Diagnosis not present

## 2013-12-05 DIAGNOSIS — R279 Unspecified lack of coordination: Secondary | ICD-10-CM | POA: Diagnosis not present

## 2013-12-05 DIAGNOSIS — M25519 Pain in unspecified shoulder: Secondary | ICD-10-CM | POA: Diagnosis not present

## 2013-12-09 DIAGNOSIS — M25519 Pain in unspecified shoulder: Secondary | ICD-10-CM | POA: Diagnosis not present

## 2013-12-09 DIAGNOSIS — M6281 Muscle weakness (generalized): Secondary | ICD-10-CM | POA: Diagnosis not present

## 2013-12-09 DIAGNOSIS — R279 Unspecified lack of coordination: Secondary | ICD-10-CM | POA: Diagnosis not present

## 2013-12-16 DIAGNOSIS — R279 Unspecified lack of coordination: Secondary | ICD-10-CM | POA: Diagnosis not present

## 2013-12-16 DIAGNOSIS — M6281 Muscle weakness (generalized): Secondary | ICD-10-CM | POA: Diagnosis not present

## 2013-12-16 DIAGNOSIS — M25519 Pain in unspecified shoulder: Secondary | ICD-10-CM | POA: Diagnosis not present

## 2013-12-19 DIAGNOSIS — R279 Unspecified lack of coordination: Secondary | ICD-10-CM | POA: Diagnosis not present

## 2013-12-19 DIAGNOSIS — M6281 Muscle weakness (generalized): Secondary | ICD-10-CM | POA: Diagnosis not present

## 2013-12-19 DIAGNOSIS — M25519 Pain in unspecified shoulder: Secondary | ICD-10-CM | POA: Diagnosis not present

## 2013-12-23 DIAGNOSIS — M25519 Pain in unspecified shoulder: Secondary | ICD-10-CM | POA: Diagnosis not present

## 2013-12-23 DIAGNOSIS — R279 Unspecified lack of coordination: Secondary | ICD-10-CM | POA: Diagnosis not present

## 2013-12-23 DIAGNOSIS — M6281 Muscle weakness (generalized): Secondary | ICD-10-CM | POA: Diagnosis not present

## 2013-12-28 DIAGNOSIS — R279 Unspecified lack of coordination: Secondary | ICD-10-CM | POA: Diagnosis not present

## 2013-12-28 DIAGNOSIS — M6281 Muscle weakness (generalized): Secondary | ICD-10-CM | POA: Diagnosis not present

## 2013-12-28 DIAGNOSIS — M25519 Pain in unspecified shoulder: Secondary | ICD-10-CM | POA: Diagnosis not present

## 2013-12-30 DIAGNOSIS — M25519 Pain in unspecified shoulder: Secondary | ICD-10-CM | POA: Diagnosis not present

## 2013-12-30 DIAGNOSIS — I10 Essential (primary) hypertension: Secondary | ICD-10-CM | POA: Diagnosis not present

## 2013-12-30 DIAGNOSIS — R609 Edema, unspecified: Secondary | ICD-10-CM | POA: Diagnosis not present

## 2013-12-31 DIAGNOSIS — M25519 Pain in unspecified shoulder: Secondary | ICD-10-CM | POA: Diagnosis not present

## 2013-12-31 DIAGNOSIS — R279 Unspecified lack of coordination: Secondary | ICD-10-CM | POA: Diagnosis not present

## 2013-12-31 DIAGNOSIS — M6281 Muscle weakness (generalized): Secondary | ICD-10-CM | POA: Diagnosis not present

## 2014-01-06 DIAGNOSIS — Z961 Presence of intraocular lens: Secondary | ICD-10-CM | POA: Diagnosis not present

## 2014-01-06 DIAGNOSIS — H43819 Vitreous degeneration, unspecified eye: Secondary | ICD-10-CM | POA: Diagnosis not present

## 2014-01-06 DIAGNOSIS — D3130 Benign neoplasm of unspecified choroid: Secondary | ICD-10-CM | POA: Diagnosis not present

## 2014-01-06 DIAGNOSIS — H35369 Drusen (degenerative) of macula, unspecified eye: Secondary | ICD-10-CM | POA: Diagnosis not present

## 2014-01-13 DIAGNOSIS — M19019 Primary osteoarthritis, unspecified shoulder: Secondary | ICD-10-CM | POA: Diagnosis not present

## 2014-01-29 DIAGNOSIS — M25519 Pain in unspecified shoulder: Secondary | ICD-10-CM | POA: Diagnosis not present

## 2014-01-29 DIAGNOSIS — M6281 Muscle weakness (generalized): Secondary | ICD-10-CM | POA: Diagnosis not present

## 2014-01-29 DIAGNOSIS — R279 Unspecified lack of coordination: Secondary | ICD-10-CM | POA: Diagnosis not present

## 2014-02-05 ENCOUNTER — Encounter: Payer: Self-pay | Admitting: General Surgery

## 2014-02-05 DIAGNOSIS — I472 Ventricular tachycardia, unspecified: Secondary | ICD-10-CM | POA: Insufficient documentation

## 2014-02-05 DIAGNOSIS — I4729 Other ventricular tachycardia: Secondary | ICD-10-CM

## 2014-02-05 DIAGNOSIS — I495 Sick sinus syndrome: Secondary | ICD-10-CM | POA: Insufficient documentation

## 2014-02-05 DIAGNOSIS — I10 Essential (primary) hypertension: Secondary | ICD-10-CM | POA: Insufficient documentation

## 2014-02-05 DIAGNOSIS — I119 Hypertensive heart disease without heart failure: Secondary | ICD-10-CM

## 2014-02-06 ENCOUNTER — Encounter: Payer: Self-pay | Admitting: Cardiology

## 2014-02-06 ENCOUNTER — Ambulatory Visit (INDEPENDENT_AMBULATORY_CARE_PROVIDER_SITE_OTHER): Payer: Medicare Other | Admitting: Cardiology

## 2014-02-06 VITALS — BP 130/70 | HR 63 | Ht 60.0 in | Wt 120.0 lb

## 2014-02-06 DIAGNOSIS — I059 Rheumatic mitral valve disease, unspecified: Secondary | ICD-10-CM | POA: Diagnosis not present

## 2014-02-06 DIAGNOSIS — I340 Nonrheumatic mitral (valve) insufficiency: Secondary | ICD-10-CM | POA: Insufficient documentation

## 2014-02-06 DIAGNOSIS — I472 Ventricular tachycardia: Secondary | ICD-10-CM | POA: Diagnosis not present

## 2014-02-06 DIAGNOSIS — I4510 Unspecified right bundle-branch block: Secondary | ICD-10-CM | POA: Diagnosis not present

## 2014-02-06 DIAGNOSIS — I10 Essential (primary) hypertension: Secondary | ICD-10-CM | POA: Diagnosis not present

## 2014-02-06 DIAGNOSIS — I4729 Other ventricular tachycardia: Secondary | ICD-10-CM

## 2014-02-06 DIAGNOSIS — I351 Nonrheumatic aortic (valve) insufficiency: Secondary | ICD-10-CM

## 2014-02-06 DIAGNOSIS — I359 Nonrheumatic aortic valve disorder, unspecified: Secondary | ICD-10-CM

## 2014-02-06 NOTE — Patient Instructions (Signed)
Your physician recommends that you continue on your current medications as directed. Please refer to the Current Medication list given to you today.  Your physician wants you to follow-up in: 1 Year with Dr Turner You will receive a reminder letter in the mail two months in advance. If you don't receive a letter, please call our office to schedule the follow-up appointment.  

## 2014-02-06 NOTE — Progress Notes (Signed)
[place] [place], [place]  [address] Phone: [phone] Fax:  [phone]  Date:  02/06/2014   ID:  Whitney Russell, DOB February 23, 1925, MRN [id number]  PCP:  Mathews Argyle, MD  Cardiologist:  Fransico Him, MD     History of Present Illness: Whitney Russell is a 80 y.o. female with a history of NSVT s/p ablation of RV outflow VT 2003, HTN, mild AR/MR and RBBB who presents today for followup. She is doing well.  She denies any chest pain, SOB, DOE, LE edema ,dizziness, palpitations or syncope.    Wt Readings from Last 3 Encounters:  No data found for Wt     Past Medical History  Diagnosis Date  . NSVT (nonsustained ventricular tachycardia)     s/p Ablation of RV outflow VT 2003  . Dyslipidemia     goal /LDL cholestrol less then 160  . Mild aortic insufficiency   . Mild mitral regurgitation   . Hypothyroidism   . GERD (gastroesophageal reflux disease)   . Osteoporosis     restart fosamax 04/2012  . Hypertension   . RBBB     Current Outpatient Prescriptions  Medication Sig Dispense Refill  . alendronate (FOSAMAX) 70 MG tablet Take 70 mg by mouth once a week. Take with a full glass of water on an empty stomach.      Marland Kitchen aliskiren (TEKTURNA) 150 MG tablet Take 150 mg by mouth daily.      Marland Kitchen amLODipine (NORVASC) 5 MG tablet Take 5 mg by mouth daily.      Marland Kitchen aspirin 81 MG tablet Take 81 mg by mouth daily.      Marland Kitchen atorvastatin (LIPITOR) 10 MG tablet Take 5 mg by mouth daily.      . Calcium Citrate-Vitamin D (CITRACAL + D PO) Take by mouth.      . levothyroxine (SYNTHROID, LEVOTHROID) 88 MCG tablet Take 88 mcg by mouth daily before breakfast.      . losartan (COZAAR) 100 MG tablet Take 100 mg by mouth daily.      . Melatonin 5 MG TABS Take by mouth as needed.      . metoprolol (LOPRESSOR) 50 MG tablet Take 50 mg by mouth 2 (two) times daily.      . Multiple Vitamin (MULTIVITAMIN) tablet Take 1 tablet by mouth daily.      . Omega-3 Fatty Acids (FISH OIL) 1000 MG CAPS  Take by mouth.      Marland Kitchen omeprazole (PRILOSEC) 20 MG capsule Take 20 mg by mouth as needed.       No current facility-administered medications for this visit.    Allergies:    Allergies  Allergen Reactions  . Adhesive [Tape]   . Augmentin [Amoxicillin-Pot Clavulanate] Diarrhea  . Sulfa Antibiotics Itching    Social History:  The patient  reports that she has never smoked. She does not have any smokeless tobacco history on file. She reports that she drinks alcohol. She reports that she does not use illicit drugs.   Family History:  The patient's family history is not on file.   ROS:  Please see the history of present illness.      All other systems reviewed and negative.   PHYSICAL EXAM: VS:  There were no vitals taken for this visit. Well nourished, well developed, in no acute distress HEENT: normal Neck: no JVD Cardiac:  normal S1, S2; RRR; no murmur Lungs:  clear to auscultation bilaterally, no wheezing, rhonchi or  rales Abd: soft, nontender, no hepatomegaly Ext: no edema Skin: warm and dry Neuro:  CNs 2-12 intact, no focal abnormalities noted  EKG:     NSR with PVC's and RBBB  ASSESSMENT AND PLAN:  1. Nonsustained VT s/p RV outflow VT ablation with no reoccurrence - continue Metoprolol 2. HTN well controlled - continue Tekturna/Losartan/Metoprolol/amlodipine 3. RBBB 4. 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

## 2014-02-24 DIAGNOSIS — M19019 Primary osteoarthritis, unspecified shoulder: Secondary | ICD-10-CM | POA: Diagnosis not present

## 2014-04-08 DIAGNOSIS — Z85828 Personal history of other malignant neoplasm of skin: Secondary | ICD-10-CM | POA: Diagnosis not present

## 2014-04-08 DIAGNOSIS — L821 Other seborrheic keratosis: Secondary | ICD-10-CM | POA: Diagnosis not present

## 2014-04-08 DIAGNOSIS — L570 Actinic keratosis: Secondary | ICD-10-CM | POA: Diagnosis not present

## 2014-04-08 DIAGNOSIS — D485 Neoplasm of uncertain behavior of skin: Secondary | ICD-10-CM | POA: Diagnosis not present

## 2014-04-08 DIAGNOSIS — D692 Other nonthrombocytopenic purpura: Secondary | ICD-10-CM | POA: Diagnosis not present

## 2014-04-28 DIAGNOSIS — H0019 Chalazion unspecified eye, unspecified eyelid: Secondary | ICD-10-CM | POA: Diagnosis not present

## 2014-05-16 DIAGNOSIS — E782 Mixed hyperlipidemia: Secondary | ICD-10-CM | POA: Diagnosis not present

## 2014-05-16 DIAGNOSIS — Z23 Encounter for immunization: Secondary | ICD-10-CM | POA: Diagnosis not present

## 2014-05-16 DIAGNOSIS — E039 Hypothyroidism, unspecified: Secondary | ICD-10-CM | POA: Diagnosis not present

## 2014-05-16 DIAGNOSIS — Z1331 Encounter for screening for depression: Secondary | ICD-10-CM | POA: Diagnosis not present

## 2014-05-16 DIAGNOSIS — Z79899 Other long term (current) drug therapy: Secondary | ICD-10-CM | POA: Diagnosis not present

## 2014-05-16 DIAGNOSIS — Z0000 Encounter for general adult medical examination without abnormal findings: Secondary | ICD-10-CM | POA: Diagnosis not present

## 2014-05-16 DIAGNOSIS — I10 Essential (primary) hypertension: Secondary | ICD-10-CM | POA: Diagnosis not present

## 2014-05-16 DIAGNOSIS — M810 Age-related osteoporosis without current pathological fracture: Secondary | ICD-10-CM | POA: Diagnosis not present

## 2014-05-26 ENCOUNTER — Other Ambulatory Visit: Payer: Self-pay

## 2014-05-26 DIAGNOSIS — Z1231 Encounter for screening mammogram for malignant neoplasm of breast: Secondary | ICD-10-CM

## 2014-06-11 ENCOUNTER — Encounter (INDEPENDENT_AMBULATORY_CARE_PROVIDER_SITE_OTHER): Payer: Self-pay

## 2014-06-11 ENCOUNTER — Ambulatory Visit
Admission: RE | Admit: 2014-06-11 | Discharge: 2014-06-11 | Disposition: A | Discharge status: Home / Self Care | Payer: Medicare Other | Source: Ambulatory Visit

## 2014-06-11 DIAGNOSIS — Z1231 Encounter for screening mammogram for malignant neoplasm of breast: Secondary | ICD-10-CM | POA: Diagnosis not present

## 2014-06-23 DIAGNOSIS — M47817 Spondylosis without myelopathy or radiculopathy, lumbosacral region: Secondary | ICD-10-CM | POA: Diagnosis not present

## 2014-07-28 DIAGNOSIS — E039 Hypothyroidism, unspecified: Secondary | ICD-10-CM | POA: Diagnosis not present

## 2014-07-31 ENCOUNTER — Other Ambulatory Visit: Payer: Self-pay | Admitting: Internal Medicine

## 2014-07-31 ENCOUNTER — Ambulatory Visit
Admission: RE | Admit: 2014-07-31 | Discharge: 2014-07-31 | Disposition: A | Discharge status: Home / Self Care | Payer: Medicare Other | Source: Ambulatory Visit | Attending: Internal Medicine | Admitting: Internal Medicine

## 2014-07-31 DIAGNOSIS — M25552 Pain in left hip: Secondary | ICD-10-CM | POA: Diagnosis not present

## 2014-07-31 DIAGNOSIS — M25512 Pain in left shoulder: Secondary | ICD-10-CM

## 2014-07-31 DIAGNOSIS — S4982XA Other specified injuries of left shoulder and upper arm, initial encounter: Secondary | ICD-10-CM | POA: Diagnosis not present

## 2014-07-31 DIAGNOSIS — S79812A Other specified injuries of left hip, initial encounter: Secondary | ICD-10-CM | POA: Diagnosis not present

## 2014-07-31 DIAGNOSIS — R030 Elevated blood-pressure reading, without diagnosis of hypertension: Secondary | ICD-10-CM | POA: Diagnosis not present

## 2014-07-31 DIAGNOSIS — W1809XA Striking against other object with subsequent fall, initial encounter: Secondary | ICD-10-CM | POA: Diagnosis not present

## 2014-08-05 DIAGNOSIS — Z23 Encounter for immunization: Secondary | ICD-10-CM | POA: Diagnosis not present

## 2014-08-06 ENCOUNTER — Emergency Department (HOSPITAL_COMMUNITY): Payer: Medicare Other

## 2014-08-06 ENCOUNTER — Emergency Department (HOSPITAL_COMMUNITY)
Admission: EM | Admit: 2014-08-06 | Discharge: 2014-08-06 | Disposition: A | Discharge status: Home / Self Care | Payer: Medicare Other | Attending: Emergency Medicine | Admitting: Emergency Medicine

## 2014-08-06 ENCOUNTER — Encounter (HOSPITAL_COMMUNITY): Payer: Self-pay | Admitting: Emergency Medicine

## 2014-08-06 DIAGNOSIS — E785 Hyperlipidemia, unspecified: Secondary | ICD-10-CM | POA: Diagnosis not present

## 2014-08-06 DIAGNOSIS — W19XXXD Unspecified fall, subsequent encounter: Secondary | ICD-10-CM

## 2014-08-06 DIAGNOSIS — S4992XA Unspecified injury of left shoulder and upper arm, initial encounter: Secondary | ICD-10-CM | POA: Insufficient documentation

## 2014-08-06 DIAGNOSIS — I10 Essential (primary) hypertension: Secondary | ICD-10-CM | POA: Diagnosis not present

## 2014-08-06 DIAGNOSIS — K219 Gastro-esophageal reflux disease without esophagitis: Secondary | ICD-10-CM | POA: Diagnosis not present

## 2014-08-06 DIAGNOSIS — Y9389 Activity, other specified: Secondary | ICD-10-CM | POA: Diagnosis not present

## 2014-08-06 DIAGNOSIS — M25512 Pain in left shoulder: Secondary | ICD-10-CM | POA: Diagnosis not present

## 2014-08-06 DIAGNOSIS — Y9289 Other specified places as the place of occurrence of the external cause: Secondary | ICD-10-CM | POA: Insufficient documentation

## 2014-08-06 DIAGNOSIS — S161XXA Strain of muscle, fascia and tendon at neck level, initial encounter: Secondary | ICD-10-CM | POA: Diagnosis not present

## 2014-08-06 DIAGNOSIS — W01198A Fall on same level from slipping, tripping and stumbling with subsequent striking against other object, initial encounter: Secondary | ICD-10-CM | POA: Diagnosis not present

## 2014-08-06 DIAGNOSIS — Z7982 Long term (current) use of aspirin: Secondary | ICD-10-CM | POA: Diagnosis not present

## 2014-08-06 DIAGNOSIS — E039 Hypothyroidism, unspecified: Secondary | ICD-10-CM | POA: Insufficient documentation

## 2014-08-06 DIAGNOSIS — Z79899 Other long term (current) drug therapy: Secondary | ICD-10-CM | POA: Insufficient documentation

## 2014-08-06 DIAGNOSIS — S79912A Unspecified injury of left hip, initial encounter: Secondary | ICD-10-CM | POA: Diagnosis not present

## 2014-08-06 DIAGNOSIS — M47812 Spondylosis without myelopathy or radiculopathy, cervical region: Secondary | ICD-10-CM | POA: Diagnosis not present

## 2014-08-06 DIAGNOSIS — M25552 Pain in left hip: Secondary | ICD-10-CM

## 2014-08-06 DIAGNOSIS — M810 Age-related osteoporosis without current pathological fracture: Secondary | ICD-10-CM | POA: Insufficient documentation

## 2014-08-06 DIAGNOSIS — M542 Cervicalgia: Secondary | ICD-10-CM | POA: Diagnosis not present

## 2014-08-06 DIAGNOSIS — S199XXA Unspecified injury of neck, initial encounter: Secondary | ICD-10-CM | POA: Diagnosis not present

## 2014-08-06 DIAGNOSIS — S169XXA Unspecified injury of muscle, fascia and tendon at neck level, initial encounter: Secondary | ICD-10-CM | POA: Diagnosis not present

## 2014-08-06 MED ORDER — HYDROCODONE-ACETAMINOPHEN 5-325 MG PO TABS
1.0000 | ORAL_TABLET | Freq: Once | ORAL | Status: AC
  Administered 2014-08-06: 2 via ORAL
  Filled 2014-08-06: qty 2

## 2014-08-06 MED ORDER — CYCLOBENZAPRINE HCL 5 MG PO TABS
5.0000 mg | ORAL_TABLET | Freq: Once | ORAL | Status: DC
Start: 2014-08-06 — End: 2014-08-06
  Filled 2014-08-06: qty 0.5
  Filled 2014-08-06: qty 1

## 2014-08-06 MED ORDER — HYDROCODONE-ACETAMINOPHEN 5-325 MG PO TABS: 1.0000 | ORAL_TABLET | ORAL | Status: DC | PRN

## 2014-08-06 NOTE — ED Notes (Signed)
[facility] last Thursday and was seen at her dr office and had xrays  Woke up yesterday w/ sharp non radiating pain to neck no  New injury a;lso has left shoulder and leg pain from oringinal fall denies numbness and tingling normally walks w cane which was cause of fall to begin with

## 2014-08-06 NOTE — ED Notes (Signed)
Pt away at CT. Family at the bedside.

## 2014-08-06 NOTE — ED Notes (Signed)
Flexeril not available in the Pyxis. Pharmacy called and stated they would send the 5 mg tablet. Patient in no pain currently.

## 2014-08-06 NOTE — Discharge Instructions (Signed)
If you were given medicines take as directed.  If you are on coumadin or contraceptives realize their levels and effectiveness is altered by many different medicines.  If you have any reaction (rash, tongues swelling, other) to the medicines stop taking and see a physician.   Please follow up as directed and return to the ER or see a physician for new or worsening symptoms.  Thank you. Filed Vitals:   08/06/14 1201 08/06/14 1215 08/06/14 1230 08/06/14 1320  BP: 155/64 148/56 145/55 125/64  Pulse: 62 59 58 62  Temp:      TempSrc:      Resp: 15 9 15 20   SpO2: 98% 93% 90% 97%

## 2014-08-06 NOTE — ED Provider Notes (Signed)
CSN: [id number]     Arrival date & time 08/06/14  1005 History   First MD Initiated Contact with Patient 08/06/14 1007     Chief Complaint  Patient presents with  . Fall  . Neck Pain     (Consider location/radiation/quality/duration/timing/severity/associated sxs/prior Treatment) HPI Comments: 80 year old female with high blood pressure, SVT history presents with bilateral neck pain gradually worsening with movement since she fell Thursday. Patient was using a cane and slipped causing her to hit her head neck, left shoulder left hip. Patient saw primary Dr. and had x-rays which were unremarkable. Patient said worsening neck pain since and persistent left hip pain. Patient has been walking with a cane since. No vomiting or headache. No neuro or confusion complaints.  Patient is a 80 y.o. female presenting with fall and neck pain. The history is provided by the patient.  Fall Pertinent negatives include no chest pain, no abdominal pain, no headaches and no shortness of breath.  Neck Pain Associated symptoms: no chest pain, no fever, no headaches, no numbness and no weakness     Past Medical History  Diagnosis Date  . NSVT (nonsustained ventricular tachycardia)     s/p Ablation of RV outflow VT 2003  . Dyslipidemia     goal /LDL cholestrol less then 160  . Mild aortic insufficiency   . Mild mitral regurgitation   . Hypothyroidism   . GERD (gastroesophageal reflux disease)   . Osteoporosis     restart fosamax 04/2012  . Hypertension   . RBBB    History reviewed. No pertinent past surgical history. No family history on file. History  Substance Use Topics  . Smoking status: Never Smoker   . Smokeless tobacco: Not on file  . Alcohol Use: Yes     Comment: occassionally   OB History   Grav Para Term Preterm Abortions TAB SAB Ect Mult Living                 Review of Systems  Constitutional: Negative for fever and chills.  HENT: Negative for congestion.   Eyes: Negative for  visual disturbance.  Respiratory: Negative for shortness of breath.   Cardiovascular: Negative for chest pain.  Gastrointestinal: Negative for vomiting and abdominal pain.  Genitourinary: Negative for dysuria and flank pain.  Musculoskeletal: Positive for arthralgias and neck pain. Negative for back pain and neck stiffness.  Skin: Negative for rash.  Neurological: Negative for weakness, light-headedness, numbness and headaches.      Allergies  Adhesive; Augmentin; and Sulfa antibiotics  Home Medications   Prior to Admission medications   Medication Sig Start Date End Date Taking? Authorizing Provider  alendronate (FOSAMAX) 70 MG tablet Take 70 mg by mouth once a week. Take with a full glass of water on an empty stomach.    Historical Provider, MD  aliskiren (TEKTURNA) 150 MG tablet Take 150 mg by mouth daily.    Historical Provider, MD  amLODipine (NORVASC) 5 MG tablet Take 5 mg by mouth daily. Taking 1/2 tablet due to swelling in legs    Historical Provider, MD  aspirin 81 MG tablet Take 81 mg by mouth daily.    Historical Provider, MD  atorvastatin (LIPITOR) 10 MG tablet Take 5 mg by mouth daily.    Historical Provider, MD  Calcium Citrate-Vitamin D (CITRACAL + D PO) Take by mouth.    Historical Provider, MD  levothyroxine (SYNTHROID, LEVOTHROID) 88 MCG tablet Take 88 mcg by mouth daily before breakfast.    Historical  Provider, MD  losartan (COZAAR) 100 MG tablet Take 100 mg by mouth daily.    Historical Provider, MD  Melatonin 5 MG TABS Take by mouth as needed.    Historical Provider, MD  metoprolol (LOPRESSOR) 50 MG tablet Take 50 mg by mouth 2 (two) times daily.    Historical Provider, MD  Multiple Vitamin (MULTIVITAMIN) tablet Take 1 tablet by mouth daily.    Historical Provider, MD  Omega-3 Fatty Acids (FISH OIL) 1000 MG CAPS Take by mouth.    Historical Provider, MD  omeprazole (PRILOSEC) 20 MG capsule Take 20 mg by mouth as needed.    Historical Provider, MD   BP 202/72   Pulse 64  Temp(Src) 98.1 F (36.7 C) (Oral)  Resp 20  SpO2 99% Physical Exam  Nursing note and vitals reviewed. Constitutional: She is oriented to person, place, and time. She appears well-developed and well-nourished.  HENT:  Head: Normocephalic and atraumatic.  Eyes: Conjunctivae are normal. Right eye exhibits no discharge. Left eye exhibits no discharge.  Neck: Normal range of motion. Neck supple. No tracheal deviation present.  Cardiovascular: Normal rate and regular rhythm.   Pulmonary/Chest: Effort normal and breath sounds normal.  Abdominal: Soft. She exhibits no distension. There is no tenderness. There is no guarding.  Musculoskeletal: She exhibits tenderness. She exhibits no edema.  Patient has midline and paraspinal cervical tenderness without step-off. Concern for rotator cuff injury in the left with positive arm drop test. Difficult exam due to pain. Patient has 5+ strength wrist extension finger flexion and elbow flexion. Sensation intact bilateral upper extremities.  Neurological: She is alert and oriented to person, place, and time.  Patient has equal sensation bilateral to palpation upper and lower extremities. Patient has 5+ strength with hip flexion knee flexion extension bilateral lower extremities. Difficulty assessing left arm strength due to pain with flexing or abduction left shoulder. I was able to passively move the shoulder to 90 however when I let go patient was unable to hold the shoulder in a position had significant pain.  Skin: Skin is warm. No rash noted.  Psychiatric: She has a normal mood and affect.    ED Course  Procedures (including critical care time) Labs Review Labs Reviewed - No data to display  Imaging Review Ct Cervical Spine Wo Contrast  08/06/2014   CLINICAL DATA:  80 year old female who tripped an fell 6 days ago. The walk yesterday with sharp non radiating neck pain. Initial encounter.  EXAM: CT CERVICAL SPINE WITHOUT CONTRAST   TECHNIQUE: Multidetector CT imaging of the cervical spine was performed without intravenous contrast. Multiplanar CT image reconstructions were also generated.  COMPARISON:  None.  FINDINGS: Osteopenia. Visualized skull base is intact. No atlanto-occipital dissociation. partially calcified bulky ligamentous hypertrophy about the odontoid. Degenerative odontoid subchondral cysts. Preserved C1-C2 alignment. No C2 fracture identified.  Anterolisthesis of C3 on C4 measuring 4 mm associated with chronic disc, endplate, and facet degeneration. No C3 or C4 fracture. Partially calcified disc material. Mild associated spinal stenosis.  Near complete loss of the C4-C5, C5-C6, and C6-C7 disc spaces with calcified disc bulge and endplate osteophytes. Suggestion of interbody ankylosis at C4-C5. A chronic facet degeneration on the right at C4-C5 and on the left at C6-C7. Up to mild spinal stenosis at these levels. No acute fracture identified.  Anterolisthesis of C7 on T1 measuring 2 mm. Associated disc space loss and severe bilateral facet hypertrophy. There are bilateral C7 pedicle fractures (series 9, image 54 and sagittal images 23  in 35), however these appear chronic with associated sclerosis and spurring. No definite spinal stenosis.  Grossly intact visualized upper thoracic levels also with widespread advanced disc degeneration. Pleural scarring in the lung apices. Tortuous proximal great vessels. No definite retropharyngeal or prevertebral fluid. Retropharyngeal course of the carotid arteries.  IMPRESSION: 1. Spondylolisthesis at C3-C4, and to a lesser extent C7-T1. There are bilateral C7 pedicle fractures associated with the latter, but these appear chronic. 2. No other fracture identified in the cervical spine. Ligamentous injury is not excluded. 3. Widespread advanced degenerative changes, with possible degenerative ankylosis at C4-C5, and multilevel mild cervical spinal stenosis.   Electronically Signed   By: Lars Pinks M.D.   On: 08/06/2014 11:53   Ct Hip Left Wo Contrast  08/06/2014   CLINICAL DATA:  Left hip pain after falling 6 days ago. Subsequent encounter.  EXAM: CT OF THE LEFT HIP WITHOUT CONTRAST  TECHNIQUE: Multidetector CT imaging of the left hip was performed according to the standard protocol. Multiplanar CT image reconstructions were also generated.  COMPARISON:  Radiographs 07/31/2014.  FINDINGS: Examination includes the left hemipelvis and left hip. The pelvis is incompletely visualized. There is no evidence of acute fracture or dislocation. The left femoral head appears normal without evidence of avascular necrosis. There are mild degenerative changes at both sacroiliac joints. More advanced degenerative changes are present within the lower lumbar spine. There is a grade 1 anterolisthesis at L5-S1 resulting in biforaminal stenosis.  There is some subcutaneous edema lateral to the left hip. No focal fluid collection is seen. There is atrophy within the left gluteus minimus and medius muscles.  The visualized pelvis demonstrates aortoiliac atherosclerosis and sigmoid diverticulosis without surrounding inflammation.  IMPRESSION: 1. No evidence of left hip fracture, dislocation or acute pelvic injury. 2. Subcutaneous edema/hemorrhage lateral to the left hip. No focal hematoma. 3. Lower lumbar spondylosis with grade 1 anterolisthesis at L5-S1.   Electronically Signed   By: Camie Patience M.D.   On: 08/06/2014 12:53     EKG Interpretation None      MDM   Final diagnoses:  Fall, subsequent encounter  Left shoulder pain  Cervical strain, acute, initial encounter  Hip pain, left   Patient clinically with musculoskeletal injuries since her fall recently. Patient had x-rays which were unremarkable. Plan for CT of the neck and CT of the left hip to 2 persistent and worsening pain. Discussed followup with orthopedics for likely MRI of the shoulder and further evaluation. Pain meds given in ER. Blood  pressure elevated likely secondary to pain, patient has no chest pain, or shortness of breath  Patient's pain improved significantly on recheck. Patient comfortable with outpatient followup for orthopedics and primary care Dr. for left shoulder. Few pain pills for home.  Results and differential diagnosis were discussed with the patient/parent/guardian. Close follow up outpatient was discussed, comfortable with the plan.   Medications  cyclobenzaprine (FLEXERIL) tablet 5 mg (5 mg Oral Not Given 08/06/14 1246)  HYDROcodone-acetaminophen (NORCO/VICODIN) 5-325 MG per tablet 1-2 tablet (2 tablets Oral Given 08/06/14 1108)    Filed Vitals:   08/06/14 1201 08/06/14 1215 08/06/14 1230 08/06/14 1320  BP: 155/64 148/56 145/55 125/64  Pulse: 62 59 58 62  Temp:      TempSrc:      Resp: 15 9 15 20   SpO2: 98% 93% 90% 97%    Final diagnoses:  Fall, subsequent encounter  Left shoulder pain  Cervical strain, acute, initial encounter  Hip pain, left  Mariea Clonts, MD 08/06/14 1345

## 2014-08-22 DIAGNOSIS — M25512 Pain in left shoulder: Secondary | ICD-10-CM | POA: Diagnosis not present

## 2014-08-22 DIAGNOSIS — R5383 Other fatigue: Secondary | ICD-10-CM | POA: Diagnosis not present

## 2014-08-22 DIAGNOSIS — S161XXS Strain of muscle, fascia and tendon at neck level, sequela: Secondary | ICD-10-CM | POA: Diagnosis not present

## 2014-08-29 DIAGNOSIS — R278 Other lack of coordination: Secondary | ICD-10-CM | POA: Diagnosis not present

## 2014-08-29 DIAGNOSIS — M6281 Muscle weakness (generalized): Secondary | ICD-10-CM | POA: Diagnosis not present

## 2014-08-29 DIAGNOSIS — R2689 Other abnormalities of gait and mobility: Secondary | ICD-10-CM | POA: Diagnosis not present

## 2014-09-01 DIAGNOSIS — I10 Essential (primary) hypertension: Secondary | ICD-10-CM | POA: Diagnosis not present

## 2014-09-01 DIAGNOSIS — R5383 Other fatigue: Secondary | ICD-10-CM | POA: Diagnosis not present

## 2014-09-02 DIAGNOSIS — M6281 Muscle weakness (generalized): Secondary | ICD-10-CM | POA: Diagnosis not present

## 2014-09-02 DIAGNOSIS — R278 Other lack of coordination: Secondary | ICD-10-CM | POA: Diagnosis not present

## 2014-09-02 DIAGNOSIS — R2689 Other abnormalities of gait and mobility: Secondary | ICD-10-CM | POA: Diagnosis not present

## 2014-10-01 DIAGNOSIS — R2689 Other abnormalities of gait and mobility: Secondary | ICD-10-CM | POA: Diagnosis not present

## 2014-10-01 DIAGNOSIS — R278 Other lack of coordination: Secondary | ICD-10-CM | POA: Diagnosis not present

## 2014-10-01 DIAGNOSIS — M6281 Muscle weakness (generalized): Secondary | ICD-10-CM | POA: Diagnosis not present

## 2014-10-03 DIAGNOSIS — R278 Other lack of coordination: Secondary | ICD-10-CM | POA: Diagnosis not present

## 2014-10-03 DIAGNOSIS — M6281 Muscle weakness (generalized): Secondary | ICD-10-CM | POA: Diagnosis not present

## 2014-10-03 DIAGNOSIS — R2689 Other abnormalities of gait and mobility: Secondary | ICD-10-CM | POA: Diagnosis not present

## 2014-10-06 DIAGNOSIS — R2689 Other abnormalities of gait and mobility: Secondary | ICD-10-CM | POA: Diagnosis not present

## 2014-10-06 DIAGNOSIS — R278 Other lack of coordination: Secondary | ICD-10-CM | POA: Diagnosis not present

## 2014-10-06 DIAGNOSIS — M6281 Muscle weakness (generalized): Secondary | ICD-10-CM | POA: Diagnosis not present

## 2014-10-09 DIAGNOSIS — R2689 Other abnormalities of gait and mobility: Secondary | ICD-10-CM | POA: Diagnosis not present

## 2014-10-09 DIAGNOSIS — M6281 Muscle weakness (generalized): Secondary | ICD-10-CM | POA: Diagnosis not present

## 2014-10-09 DIAGNOSIS — R278 Other lack of coordination: Secondary | ICD-10-CM | POA: Diagnosis not present

## 2014-10-10 DIAGNOSIS — M6281 Muscle weakness (generalized): Secondary | ICD-10-CM | POA: Diagnosis not present

## 2014-10-10 DIAGNOSIS — R278 Other lack of coordination: Secondary | ICD-10-CM | POA: Diagnosis not present

## 2014-10-10 DIAGNOSIS — R2689 Other abnormalities of gait and mobility: Secondary | ICD-10-CM | POA: Diagnosis not present

## 2014-10-13 DIAGNOSIS — M6281 Muscle weakness (generalized): Secondary | ICD-10-CM | POA: Diagnosis not present

## 2014-10-13 DIAGNOSIS — R278 Other lack of coordination: Secondary | ICD-10-CM | POA: Diagnosis not present

## 2014-10-13 DIAGNOSIS — R2689 Other abnormalities of gait and mobility: Secondary | ICD-10-CM | POA: Diagnosis not present

## 2014-10-15 DIAGNOSIS — R2689 Other abnormalities of gait and mobility: Secondary | ICD-10-CM | POA: Diagnosis not present

## 2014-10-15 DIAGNOSIS — R278 Other lack of coordination: Secondary | ICD-10-CM | POA: Diagnosis not present

## 2014-10-15 DIAGNOSIS — M6281 Muscle weakness (generalized): Secondary | ICD-10-CM | POA: Diagnosis not present

## 2014-10-17 DIAGNOSIS — R278 Other lack of coordination: Secondary | ICD-10-CM | POA: Diagnosis not present

## 2014-10-17 DIAGNOSIS — M6281 Muscle weakness (generalized): Secondary | ICD-10-CM | POA: Diagnosis not present

## 2014-10-17 DIAGNOSIS — R2689 Other abnormalities of gait and mobility: Secondary | ICD-10-CM | POA: Diagnosis not present

## 2014-10-20 DIAGNOSIS — M6281 Muscle weakness (generalized): Secondary | ICD-10-CM | POA: Diagnosis not present

## 2014-10-20 DIAGNOSIS — R278 Other lack of coordination: Secondary | ICD-10-CM | POA: Diagnosis not present

## 2014-10-20 DIAGNOSIS — R2689 Other abnormalities of gait and mobility: Secondary | ICD-10-CM | POA: Diagnosis not present

## 2014-10-21 DIAGNOSIS — M6281 Muscle weakness (generalized): Secondary | ICD-10-CM | POA: Diagnosis not present

## 2014-10-21 DIAGNOSIS — R278 Other lack of coordination: Secondary | ICD-10-CM | POA: Diagnosis not present

## 2014-10-21 DIAGNOSIS — R2689 Other abnormalities of gait and mobility: Secondary | ICD-10-CM | POA: Diagnosis not present

## 2014-10-23 DIAGNOSIS — R2689 Other abnormalities of gait and mobility: Secondary | ICD-10-CM | POA: Diagnosis not present

## 2014-10-23 DIAGNOSIS — R278 Other lack of coordination: Secondary | ICD-10-CM | POA: Diagnosis not present

## 2014-10-23 DIAGNOSIS — M6281 Muscle weakness (generalized): Secondary | ICD-10-CM | POA: Diagnosis not present

## 2014-10-27 DIAGNOSIS — R278 Other lack of coordination: Secondary | ICD-10-CM | POA: Diagnosis not present

## 2014-10-27 DIAGNOSIS — M6281 Muscle weakness (generalized): Secondary | ICD-10-CM | POA: Diagnosis not present

## 2014-10-27 DIAGNOSIS — R2689 Other abnormalities of gait and mobility: Secondary | ICD-10-CM | POA: Diagnosis not present

## 2014-10-29 DIAGNOSIS — M6281 Muscle weakness (generalized): Secondary | ICD-10-CM | POA: Diagnosis not present

## 2014-10-29 DIAGNOSIS — R2689 Other abnormalities of gait and mobility: Secondary | ICD-10-CM | POA: Diagnosis not present

## 2014-10-29 DIAGNOSIS — R278 Other lack of coordination: Secondary | ICD-10-CM | POA: Diagnosis not present

## 2014-10-30 DIAGNOSIS — R2689 Other abnormalities of gait and mobility: Secondary | ICD-10-CM | POA: Diagnosis not present

## 2014-10-30 DIAGNOSIS — M6281 Muscle weakness (generalized): Secondary | ICD-10-CM | POA: Diagnosis not present

## 2014-10-30 DIAGNOSIS — R278 Other lack of coordination: Secondary | ICD-10-CM | POA: Diagnosis not present

## 2014-11-03 DIAGNOSIS — M6281 Muscle weakness (generalized): Secondary | ICD-10-CM | POA: Diagnosis not present

## 2014-11-03 DIAGNOSIS — R278 Other lack of coordination: Secondary | ICD-10-CM | POA: Diagnosis not present

## 2014-11-03 DIAGNOSIS — R2689 Other abnormalities of gait and mobility: Secondary | ICD-10-CM | POA: Diagnosis not present

## 2014-11-04 DIAGNOSIS — M6281 Muscle weakness (generalized): Secondary | ICD-10-CM | POA: Diagnosis not present

## 2014-11-04 DIAGNOSIS — R2689 Other abnormalities of gait and mobility: Secondary | ICD-10-CM | POA: Diagnosis not present

## 2014-11-04 DIAGNOSIS — R278 Other lack of coordination: Secondary | ICD-10-CM | POA: Diagnosis not present

## 2014-11-06 DIAGNOSIS — R278 Other lack of coordination: Secondary | ICD-10-CM | POA: Diagnosis not present

## 2014-11-06 DIAGNOSIS — R2689 Other abnormalities of gait and mobility: Secondary | ICD-10-CM | POA: Diagnosis not present

## 2014-11-06 DIAGNOSIS — M6281 Muscle weakness (generalized): Secondary | ICD-10-CM | POA: Diagnosis not present

## 2014-11-10 DIAGNOSIS — I10 Essential (primary) hypertension: Secondary | ICD-10-CM | POA: Diagnosis not present

## 2014-11-10 DIAGNOSIS — Z79899 Other long term (current) drug therapy: Secondary | ICD-10-CM | POA: Diagnosis not present

## 2014-11-10 DIAGNOSIS — I340 Nonrheumatic mitral (valve) insufficiency: Secondary | ICD-10-CM | POA: Diagnosis not present

## 2014-11-10 DIAGNOSIS — E780 Pure hypercholesterolemia: Secondary | ICD-10-CM | POA: Diagnosis not present

## 2014-11-11 DIAGNOSIS — R278 Other lack of coordination: Secondary | ICD-10-CM | POA: Diagnosis not present

## 2014-11-11 DIAGNOSIS — R2689 Other abnormalities of gait and mobility: Secondary | ICD-10-CM | POA: Diagnosis not present

## 2014-11-11 DIAGNOSIS — M6281 Muscle weakness (generalized): Secondary | ICD-10-CM | POA: Diagnosis not present

## 2014-11-12 DIAGNOSIS — R278 Other lack of coordination: Secondary | ICD-10-CM | POA: Diagnosis not present

## 2014-11-12 DIAGNOSIS — M6281 Muscle weakness (generalized): Secondary | ICD-10-CM | POA: Diagnosis not present

## 2014-11-12 DIAGNOSIS — R2689 Other abnormalities of gait and mobility: Secondary | ICD-10-CM | POA: Diagnosis not present

## 2014-11-14 DIAGNOSIS — M6281 Muscle weakness (generalized): Secondary | ICD-10-CM | POA: Diagnosis not present

## 2014-11-14 DIAGNOSIS — R278 Other lack of coordination: Secondary | ICD-10-CM | POA: Diagnosis not present

## 2014-11-14 DIAGNOSIS — R2689 Other abnormalities of gait and mobility: Secondary | ICD-10-CM | POA: Diagnosis not present

## 2014-11-17 DIAGNOSIS — R278 Other lack of coordination: Secondary | ICD-10-CM | POA: Diagnosis not present

## 2014-11-17 DIAGNOSIS — M6281 Muscle weakness (generalized): Secondary | ICD-10-CM | POA: Diagnosis not present

## 2014-11-17 DIAGNOSIS — R2689 Other abnormalities of gait and mobility: Secondary | ICD-10-CM | POA: Diagnosis not present

## 2014-11-19 DIAGNOSIS — R2689 Other abnormalities of gait and mobility: Secondary | ICD-10-CM | POA: Diagnosis not present

## 2014-11-19 DIAGNOSIS — R278 Other lack of coordination: Secondary | ICD-10-CM | POA: Diagnosis not present

## 2014-11-19 DIAGNOSIS — M6281 Muscle weakness (generalized): Secondary | ICD-10-CM | POA: Diagnosis not present

## 2014-11-20 DIAGNOSIS — R2689 Other abnormalities of gait and mobility: Secondary | ICD-10-CM | POA: Diagnosis not present

## 2014-11-20 DIAGNOSIS — M6281 Muscle weakness (generalized): Secondary | ICD-10-CM | POA: Diagnosis not present

## 2014-11-20 DIAGNOSIS — R278 Other lack of coordination: Secondary | ICD-10-CM | POA: Diagnosis not present

## 2014-11-24 DIAGNOSIS — R2689 Other abnormalities of gait and mobility: Secondary | ICD-10-CM | POA: Diagnosis not present

## 2014-11-24 DIAGNOSIS — R278 Other lack of coordination: Secondary | ICD-10-CM | POA: Diagnosis not present

## 2014-11-24 DIAGNOSIS — M6281 Muscle weakness (generalized): Secondary | ICD-10-CM | POA: Diagnosis not present

## 2014-11-25 DIAGNOSIS — R278 Other lack of coordination: Secondary | ICD-10-CM | POA: Diagnosis not present

## 2014-11-25 DIAGNOSIS — R2689 Other abnormalities of gait and mobility: Secondary | ICD-10-CM | POA: Diagnosis not present

## 2014-11-25 DIAGNOSIS — M6281 Muscle weakness (generalized): Secondary | ICD-10-CM | POA: Diagnosis not present

## 2014-11-27 ENCOUNTER — Encounter: Payer: Self-pay | Admitting: Cardiology

## 2014-11-28 DIAGNOSIS — R278 Other lack of coordination: Secondary | ICD-10-CM | POA: Diagnosis not present

## 2014-11-28 DIAGNOSIS — R2689 Other abnormalities of gait and mobility: Secondary | ICD-10-CM | POA: Diagnosis not present

## 2014-11-28 DIAGNOSIS — M6281 Muscle weakness (generalized): Secondary | ICD-10-CM | POA: Diagnosis not present

## 2014-12-01 DIAGNOSIS — R2689 Other abnormalities of gait and mobility: Secondary | ICD-10-CM | POA: Diagnosis not present

## 2014-12-01 DIAGNOSIS — R278 Other lack of coordination: Secondary | ICD-10-CM | POA: Diagnosis not present

## 2014-12-01 DIAGNOSIS — M6281 Muscle weakness (generalized): Secondary | ICD-10-CM | POA: Diagnosis not present

## 2014-12-03 DIAGNOSIS — M6281 Muscle weakness (generalized): Secondary | ICD-10-CM | POA: Diagnosis not present

## 2014-12-03 DIAGNOSIS — R278 Other lack of coordination: Secondary | ICD-10-CM | POA: Diagnosis not present

## 2014-12-03 DIAGNOSIS — R2689 Other abnormalities of gait and mobility: Secondary | ICD-10-CM | POA: Diagnosis not present

## 2014-12-05 DIAGNOSIS — M6281 Muscle weakness (generalized): Secondary | ICD-10-CM | POA: Diagnosis not present

## 2014-12-05 DIAGNOSIS — R2689 Other abnormalities of gait and mobility: Secondary | ICD-10-CM | POA: Diagnosis not present

## 2014-12-05 DIAGNOSIS — R278 Other lack of coordination: Secondary | ICD-10-CM | POA: Diagnosis not present

## 2014-12-08 DIAGNOSIS — R278 Other lack of coordination: Secondary | ICD-10-CM | POA: Diagnosis not present

## 2014-12-08 DIAGNOSIS — R2689 Other abnormalities of gait and mobility: Secondary | ICD-10-CM | POA: Diagnosis not present

## 2014-12-08 DIAGNOSIS — M6281 Muscle weakness (generalized): Secondary | ICD-10-CM | POA: Diagnosis not present

## 2014-12-10 DIAGNOSIS — R278 Other lack of coordination: Secondary | ICD-10-CM | POA: Diagnosis not present

## 2014-12-10 DIAGNOSIS — R2689 Other abnormalities of gait and mobility: Secondary | ICD-10-CM | POA: Diagnosis not present

## 2014-12-10 DIAGNOSIS — M6281 Muscle weakness (generalized): Secondary | ICD-10-CM | POA: Diagnosis not present

## 2014-12-12 DIAGNOSIS — R2689 Other abnormalities of gait and mobility: Secondary | ICD-10-CM | POA: Diagnosis not present

## 2014-12-12 DIAGNOSIS — M6281 Muscle weakness (generalized): Secondary | ICD-10-CM | POA: Diagnosis not present

## 2014-12-12 DIAGNOSIS — R278 Other lack of coordination: Secondary | ICD-10-CM | POA: Diagnosis not present

## 2014-12-15 DIAGNOSIS — R278 Other lack of coordination: Secondary | ICD-10-CM | POA: Diagnosis not present

## 2014-12-15 DIAGNOSIS — M6281 Muscle weakness (generalized): Secondary | ICD-10-CM | POA: Diagnosis not present

## 2014-12-15 DIAGNOSIS — R2689 Other abnormalities of gait and mobility: Secondary | ICD-10-CM | POA: Diagnosis not present

## 2014-12-17 DIAGNOSIS — R2689 Other abnormalities of gait and mobility: Secondary | ICD-10-CM | POA: Diagnosis not present

## 2014-12-17 DIAGNOSIS — M6281 Muscle weakness (generalized): Secondary | ICD-10-CM | POA: Diagnosis not present

## 2014-12-17 DIAGNOSIS — R278 Other lack of coordination: Secondary | ICD-10-CM | POA: Diagnosis not present

## 2014-12-19 DIAGNOSIS — R278 Other lack of coordination: Secondary | ICD-10-CM | POA: Diagnosis not present

## 2014-12-19 DIAGNOSIS — M6281 Muscle weakness (generalized): Secondary | ICD-10-CM | POA: Diagnosis not present

## 2014-12-19 DIAGNOSIS — R2689 Other abnormalities of gait and mobility: Secondary | ICD-10-CM | POA: Diagnosis not present

## 2014-12-22 DIAGNOSIS — R2689 Other abnormalities of gait and mobility: Secondary | ICD-10-CM | POA: Diagnosis not present

## 2014-12-22 DIAGNOSIS — M6281 Muscle weakness (generalized): Secondary | ICD-10-CM | POA: Diagnosis not present

## 2014-12-22 DIAGNOSIS — R278 Other lack of coordination: Secondary | ICD-10-CM | POA: Diagnosis not present

## 2014-12-24 DIAGNOSIS — M6281 Muscle weakness (generalized): Secondary | ICD-10-CM | POA: Diagnosis not present

## 2014-12-24 DIAGNOSIS — R278 Other lack of coordination: Secondary | ICD-10-CM | POA: Diagnosis not present

## 2014-12-24 DIAGNOSIS — R2689 Other abnormalities of gait and mobility: Secondary | ICD-10-CM | POA: Diagnosis not present

## 2014-12-26 DIAGNOSIS — M6281 Muscle weakness (generalized): Secondary | ICD-10-CM | POA: Diagnosis not present

## 2014-12-26 DIAGNOSIS — R278 Other lack of coordination: Secondary | ICD-10-CM | POA: Diagnosis not present

## 2014-12-26 DIAGNOSIS — R2689 Other abnormalities of gait and mobility: Secondary | ICD-10-CM | POA: Diagnosis not present

## 2014-12-29 DIAGNOSIS — R2689 Other abnormalities of gait and mobility: Secondary | ICD-10-CM | POA: Diagnosis not present

## 2014-12-29 DIAGNOSIS — R278 Other lack of coordination: Secondary | ICD-10-CM | POA: Diagnosis not present

## 2014-12-29 DIAGNOSIS — M6281 Muscle weakness (generalized): Secondary | ICD-10-CM | POA: Diagnosis not present

## 2014-12-30 DIAGNOSIS — R278 Other lack of coordination: Secondary | ICD-10-CM | POA: Diagnosis not present

## 2014-12-30 DIAGNOSIS — R2689 Other abnormalities of gait and mobility: Secondary | ICD-10-CM | POA: Diagnosis not present

## 2014-12-30 DIAGNOSIS — M6281 Muscle weakness (generalized): Secondary | ICD-10-CM | POA: Diagnosis not present

## 2015-01-01 DIAGNOSIS — M6281 Muscle weakness (generalized): Secondary | ICD-10-CM | POA: Diagnosis not present

## 2015-01-01 DIAGNOSIS — R2689 Other abnormalities of gait and mobility: Secondary | ICD-10-CM | POA: Diagnosis not present

## 2015-01-01 DIAGNOSIS — R278 Other lack of coordination: Secondary | ICD-10-CM | POA: Diagnosis not present

## 2015-01-12 DIAGNOSIS — D3132 Benign neoplasm of left choroid: Secondary | ICD-10-CM | POA: Diagnosis not present

## 2015-01-12 DIAGNOSIS — H26493 Other secondary cataract, bilateral: Secondary | ICD-10-CM | POA: Diagnosis not present

## 2015-01-12 DIAGNOSIS — H31001 Unspecified chorioretinal scars, right eye: Secondary | ICD-10-CM | POA: Diagnosis not present

## 2015-01-12 DIAGNOSIS — H35363 Drusen (degenerative) of macula, bilateral: Secondary | ICD-10-CM | POA: Diagnosis not present

## 2015-03-14 NOTE — Progress Notes (Signed)
Cardiology Office Note   Date:  03/16/2015   ID:  Whitney Russell, DOB 1924/11/24, MRN [id number]  PCP:  Mathews Argyle, MD    Chief Complaint  Patient presents with  . Irregular Heart Beat  . Follow-up    hypertension      History of Present Illness: Whitney Russell is a 81 y.o. female with a history of NSVT s/p ablation of RV outflow VT 2003, HTN, mild AR/MR and RBBB who presents today for followup. She is doing well. She denies any chest pain, SOB, DOE, LE edema ,dizziness, palpitations or syncope.     Past Medical History  Diagnosis Date  . NSVT (nonsustained ventricular tachycardia)     s/p Ablation of RV outflow VT 2003  . Dyslipidemia     goal /LDL cholestrol less then 160  . Mild aortic insufficiency   . Mild mitral regurgitation   . Hypothyroidism   . GERD (gastroesophageal reflux disease)   . Osteoporosis     restart fosamax 04/2012  . Hypertension   . RBBB     History reviewed. No pertinent past surgical history.   Current Outpatient Prescriptions  Medication Sig Dispense Refill  . alendronate (FOSAMAX) 70 MG tablet Take 70 mg by mouth once a week. Take with a full glass of water on an empty stomach.    Marland Kitchen aliskiren (TEKTURNA) 150 MG tablet Take 150 mg by mouth daily.    Marland Kitchen amLODipine (NORVASC) 5 MG tablet Take 5 mg by mouth daily. Taking 1/2 tablet due to swelling in legs    . aspirin 81 MG tablet Take 81 mg by mouth daily.    Marland Kitchen atorvastatin (LIPITOR) 10 MG tablet Take 5 mg by mouth daily.    . Calcium Citrate-Vitamin D (CITRACAL + D PO) Take by mouth.    . losartan (COZAAR) 100 MG tablet Take 100 mg by mouth daily.    . Melatonin 5 MG TABS Take by mouth as needed.    . metoprolol (LOPRESSOR) 50 MG tablet Take 50 mg by mouth 2 (two) times daily.    . Multiple Vitamin (MULTIVITAMIN) tablet Take 1 tablet by mouth daily.    . Omega-3 Fatty Acids (FISH OIL) 1000 MG CAPS Take by mouth.    . SYNTHROID 112 MCG tablet Take 112 mcg by mouth daily.      No current facility-administered medications for this visit.    Allergies:   Adhesive; Augmentin; and Sulfa antibiotics    Social History:  The patient  reports that she has never smoked. She does not have any smokeless tobacco history on file. She reports that she drinks alcohol. She reports that she does not use illicit drugs.   Family History:  The patient's family history includes Diabetes in her mother; Heart Problems in her brother.    ROS:  Please see the history of present illness.   Otherwise, review of systems are positive for none.   All other systems are reviewed and negative.    PHYSICAL EXAM: VS:  BP 130/68 mmHg  Pulse 57  Ht 5' (1.524 m)  Wt 124 lb (56.246 kg)  BMI 24.22 kg/m2 , BMI Body mass index is 24.22 kg/(m^2). GEN: Well nourished, well developed, in no acute distress HEENT: normal Neck: no JVD, carotid bruits, or masses Cardiac: RRR; no murmurs, rubs, or gallops,no edema  Respiratory:  clear to auscultation bilaterally, normal work of breathing GI: soft, nontender, nondistended, + BS MS: no deformity or atrophy Skin: warm  and dry, no rash Neuro:  Strength and sensation are intact Psych: euthymic mood, full affect   EKG:  EKG was ordered today and shows sinus bradycardia with PVC's and RBBB    Recent Labs: No results found for requested labs within last 365 days.    Lipid Panel No results found for: CHOL, TRIG, HDL, CHOLHDL, VLDL, LDLCALC, LDLDIRECT    Wt Readings from Last 3 Encounters:  03/16/15 124 lb (56.246 kg)  02/06/14 120 lb (54.432 kg)    ASSESSMENT AND PLAN:  1. Nonsustained VT s/p RV outflow VT ablation with no reoccurrence - continue Metoprolol 2. HTN well controlled - continue Tekturna/Losartan/Metoprolol/amlodipine - check BMET 3. RBBB 4. Mild AR/MR    Current medicines are reviewed at length with the patient today.  The patient does not have concerns regarding medicines.  The following changes have been made:  no  change  Labs/ tests ordered today include: see above assessment and plan No orders of the defined types were placed in this encounter.     Disposition:   FU with me in 1 year   Signed, Sueanne Margarita, MD  03/16/2015 11:04 AM    [facility] [address], [place], [place]  [address] Phone: [phone]; Fax: [phone]

## 2015-03-16 ENCOUNTER — Ambulatory Visit (INDEPENDENT_AMBULATORY_CARE_PROVIDER_SITE_OTHER): Payer: Medicare Other | Admitting: Cardiology

## 2015-03-16 ENCOUNTER — Encounter: Payer: Self-pay | Admitting: Cardiology

## 2015-03-16 VITALS — BP 130/68 | HR 57 | Ht 60.0 in | Wt 124.0 lb

## 2015-03-16 DIAGNOSIS — I10 Essential (primary) hypertension: Secondary | ICD-10-CM

## 2015-03-16 DIAGNOSIS — I351 Nonrheumatic aortic (valve) insufficiency: Secondary | ICD-10-CM

## 2015-03-16 DIAGNOSIS — I4510 Unspecified right bundle-branch block: Secondary | ICD-10-CM | POA: Diagnosis not present

## 2015-03-16 DIAGNOSIS — I4729 Other ventricular tachycardia: Secondary | ICD-10-CM

## 2015-03-16 DIAGNOSIS — I340 Nonrheumatic mitral (valve) insufficiency: Secondary | ICD-10-CM

## 2015-03-16 DIAGNOSIS — I472 Ventricular tachycardia: Secondary | ICD-10-CM

## 2015-03-16 LAB — BASIC METABOLIC PANEL
BUN: 14 mg/dL (ref 6–23)
CALCIUM: 9.9 mg/dL (ref 8.4–10.5)
CHLORIDE: 100 meq/L (ref 96–112)
CO2: 32 mEq/L (ref 19–32)
CREATININE: 0.8 mg/dL (ref 0.40–1.20)
GFR: 72.01 mL/min (ref >60.00)
Glucose, Bld: 74 mg/dL (ref 70–99)
Potassium: 4.2 mEq/L (ref 3.5–5.1)
Sodium: 136 mEq/L (ref 135–145)

## 2015-03-16 NOTE — Patient Instructions (Signed)
Medication Instructions:  Your physician recommends that you continue on your current medications as directed. Please refer to the Current Medication list given to you today.   Labwork: TODAY: BMET  Testing/Procedures: None  Follow-Up: Your physician wants you to follow-up in: 1 year with Dr. Radford Pax. You will receive a reminder letter in the mail two months in advance. If you don't receive a letter, please call our office to schedule the follow-up appointment.   Any Other Special Instructions Will Be Listed Below (If Applicable).

## 2015-04-01 DIAGNOSIS — H6121 Impacted cerumen, right ear: Secondary | ICD-10-CM | POA: Diagnosis not present

## 2015-05-12 ENCOUNTER — Other Ambulatory Visit: Payer: Self-pay

## 2015-05-12 DIAGNOSIS — Z1231 Encounter for screening mammogram for malignant neoplasm of breast: Secondary | ICD-10-CM

## 2015-05-20 DIAGNOSIS — Z79899 Other long term (current) drug therapy: Secondary | ICD-10-CM | POA: Diagnosis not present

## 2015-05-20 DIAGNOSIS — I340 Nonrheumatic mitral (valve) insufficiency: Secondary | ICD-10-CM | POA: Diagnosis not present

## 2015-05-20 DIAGNOSIS — I10 Essential (primary) hypertension: Secondary | ICD-10-CM | POA: Diagnosis not present

## 2015-05-20 DIAGNOSIS — E780 Pure hypercholesterolemia: Secondary | ICD-10-CM | POA: Diagnosis not present

## 2015-05-20 DIAGNOSIS — M810 Age-related osteoporosis without current pathological fracture: Secondary | ICD-10-CM | POA: Diagnosis not present

## 2015-05-20 DIAGNOSIS — Z1389 Encounter for screening for other disorder: Secondary | ICD-10-CM | POA: Diagnosis not present

## 2015-05-20 DIAGNOSIS — I351 Nonrheumatic aortic (valve) insufficiency: Secondary | ICD-10-CM | POA: Diagnosis not present

## 2015-05-20 DIAGNOSIS — Z0000 Encounter for general adult medical examination without abnormal findings: Secondary | ICD-10-CM | POA: Diagnosis not present

## 2015-05-20 DIAGNOSIS — K219 Gastro-esophageal reflux disease without esophagitis: Secondary | ICD-10-CM | POA: Diagnosis not present

## 2015-05-20 DIAGNOSIS — R2689 Other abnormalities of gait and mobility: Secondary | ICD-10-CM | POA: Diagnosis not present

## 2015-05-20 DIAGNOSIS — E039 Hypothyroidism, unspecified: Secondary | ICD-10-CM | POA: Diagnosis not present

## 2015-06-22 ENCOUNTER — Ambulatory Visit
Admission: RE | Admit: 2015-06-22 | Discharge: 2015-06-22 | Disposition: A | Discharge status: Home / Self Care | Payer: Medicare Other | Source: Ambulatory Visit

## 2015-06-22 DIAGNOSIS — Z1231 Encounter for screening mammogram for malignant neoplasm of breast: Secondary | ICD-10-CM | POA: Diagnosis not present

## 2015-06-24 DIAGNOSIS — M810 Age-related osteoporosis without current pathological fracture: Secondary | ICD-10-CM | POA: Diagnosis not present

## 2015-08-07 DIAGNOSIS — Z23 Encounter for immunization: Secondary | ICD-10-CM | POA: Diagnosis not present

## 2015-10-12 DIAGNOSIS — H26493 Other secondary cataract, bilateral: Secondary | ICD-10-CM | POA: Diagnosis not present

## 2015-10-12 DIAGNOSIS — Z961 Presence of intraocular lens: Secondary | ICD-10-CM | POA: Diagnosis not present

## 2015-10-12 DIAGNOSIS — D3132 Benign neoplasm of left choroid: Secondary | ICD-10-CM | POA: Diagnosis not present

## 2015-10-12 DIAGNOSIS — H35363 Drusen (degenerative) of macula, bilateral: Secondary | ICD-10-CM | POA: Diagnosis not present

## 2015-11-18 DIAGNOSIS — I10 Essential (primary) hypertension: Secondary | ICD-10-CM | POA: Diagnosis not present

## 2015-11-18 DIAGNOSIS — Z79899 Other long term (current) drug therapy: Secondary | ICD-10-CM | POA: Diagnosis not present

## 2015-11-30 DIAGNOSIS — L821 Other seborrheic keratosis: Secondary | ICD-10-CM | POA: Diagnosis not present

## 2015-11-30 DIAGNOSIS — Z85828 Personal history of other malignant neoplasm of skin: Secondary | ICD-10-CM | POA: Diagnosis not present

## 2015-11-30 DIAGNOSIS — L570 Actinic keratosis: Secondary | ICD-10-CM | POA: Diagnosis not present

## 2016-03-23 ENCOUNTER — Encounter: Payer: Self-pay | Admitting: Cardiology

## 2016-03-23 ENCOUNTER — Ambulatory Visit (INDEPENDENT_AMBULATORY_CARE_PROVIDER_SITE_OTHER): Payer: Medicare Other | Admitting: Cardiology

## 2016-03-23 VITALS — BP 152/72 | HR 50 | Ht 60.0 in | Wt 124.1 lb

## 2016-03-23 DIAGNOSIS — I495 Sick sinus syndrome: Secondary | ICD-10-CM | POA: Diagnosis not present

## 2016-03-23 DIAGNOSIS — I4510 Unspecified right bundle-branch block: Secondary | ICD-10-CM | POA: Diagnosis not present

## 2016-03-23 DIAGNOSIS — R9431 Abnormal electrocardiogram [ECG] [EKG]: Secondary | ICD-10-CM

## 2016-03-23 DIAGNOSIS — I4729 Other ventricular tachycardia: Secondary | ICD-10-CM

## 2016-03-23 DIAGNOSIS — I472 Ventricular tachycardia: Secondary | ICD-10-CM

## 2016-03-23 DIAGNOSIS — I10 Essential (primary) hypertension: Secondary | ICD-10-CM

## 2016-03-23 HISTORY — DX: Abnormal electrocardiogram (ECG) (EKG): R94.31

## 2016-03-23 MED ORDER — HYDRALAZINE HCL 10 MG PO TABS: 10.0000 mg | ORAL_TABLET | Freq: Three times a day (TID) | ORAL | Status: AC

## 2016-03-23 NOTE — Progress Notes (Signed)
Cardiology Office Note    Date:  03/23/2016   ID:  Dianah Hebel Taliaferro, DOB [birth year], MRN [id number]  PCP:  Mathews Argyle, MD  Cardiologist:  Fransico Him, MD   No chief complaint on file.   History of Present Illness:  Whitney Russell is a 82 y.o. female with a history of NSVT s/p ablation of RV outflow VT 2003, HTN, mild AR/MR and RBBB who presents today for followup. She is doing well. She denies any chest pain, SOB, DOE, LE edema (except with prolonged sitting) ,dizziness, palpitations or syncope.    Past Medical History  Diagnosis Date  . NSVT (nonsustained ventricular tachycardia) (HCC)     s/p Ablation of RV outflow VT 2003  . Dyslipidemia     goal /LDL cholestrol less then 160  . Mild aortic insufficiency   . Mild mitral regurgitation   . Hypothyroidism   . GERD (gastroesophageal reflux disease)   . Osteoporosis     restart fosamax 04/2012  . Hypertension   . RBBB   . Abnormal ECG 03/23/2016    No past surgical history on file.  Current Medications: Outpatient Prescriptions Prior to Visit  Medication Sig Dispense Refill  . alendronate (FOSAMAX) 70 MG tablet Take 70 mg by mouth once a week. Take with a full glass of water on an empty stomach.    Marland Kitchen aliskiren (TEKTURNA) 150 MG tablet Take 150 mg by mouth daily.    Marland Kitchen amLODipine (NORVASC) 5 MG tablet Take 5 mg by mouth daily. Taking 1/2 tablet due to swelling in legs    . aspirin 81 MG tablet Take 81 mg by mouth daily.    Marland Kitchen atorvastatin (LIPITOR) 10 MG tablet Take 5 mg by mouth daily.    Marland Kitchen losartan (COZAAR) 100 MG tablet Take 100 mg by mouth daily.    . Melatonin 5 MG TABS Take 0.5 tablets by mouth at bedtime as needed (sleep).     . metoprolol (LOPRESSOR) 50 MG tablet Take 50 mg by mouth 2 (two) times daily.    . Multiple Vitamin (MULTIVITAMIN) tablet Take 1 tablet by mouth daily.    . Omega-3 Fatty Acids (FISH OIL) 1000 MG CAPS Take 1 capsule by mouth daily.     Marland Kitchen SYNTHROID 112 MCG tablet Take 112 mcg by  mouth daily.    . Calcium Citrate-Vitamin D (CITRACAL + D PO) Take by mouth. Reported on 03/23/2016     No facility-administered medications prior to visit.     Allergies:   Adhesive; Augmentin; and Sulfa antibiotics   Social History   Social History  . Marital Status: Widowed    Spouse Name: N/A  . Number of Children: 2  . Years of Education: college   Occupational History  . retired    Social History Main Topics  . Smoking status: Never Smoker   . Smokeless tobacco: Never Used  . Alcohol Use: 0.0 oz/week    0 Standard drinks or equivalent per week     Comment: occassionally  . Drug Use: No  . Sexual Activity: Not Asked   Other Topics Concern  . None   Social History Narrative     Family History:  The patient's family history includes Diabetes in her mother; Heart Problems in her brother.   ROS:   Please see the history of present illness.    ROS All other systems reviewed and are negative.   PHYSICAL EXAM:   VS:  BP 152/72 mmHg  Pulse 50  Ht 5' (1.524 m)  Wt 124 lb 1.9 oz (56.3 kg)  BMI 24.24 kg/m2   GEN: Well nourished, well developed, in no acute distress HEENT: normal Neck: no JVD, carotid bruits, or masses Cardiac: RRR; no murmurs, rubs, or gallops,no edema.  Intact distal pulses bilaterally.  Respiratory:  clear to auscultation bilaterally, normal work of breathing GI: soft, nontender, nondistended, + BS MS: no deformity or atrophy Skin: warm and dry, no rash Neuro:  Alert and Oriented x 3, Strength and sensation are intact Psych: euthymic mood, full affect  Wt Readings from Last 3 Encounters:  03/23/16 124 lb 1.9 oz (56.3 kg)  03/16/15 124 lb (56.246 kg)  02/06/14 120 lb (54.432 kg)      Studies/Labs Reviewed:   EKG:  EKG is  ordered today.  The ekg ordered today demonstrates Sinus bradycardia at 50bpm with RBBB and inferior infarct  Recent Labs: No results found for requested labs within last 365 days.   Lipid Panel No results found  for: CHOL, TRIG, HDL, CHOLHDL, VLDL, LDLCALC, LDLDIRECT  Additional studies/ records that were reviewed today include:  none    ASSESSMENT:    1. Paroxysmal ventricular tachycardia ([place])   2. Benign essential HTN   3. Sinoatrial node dysfunction (HCC)   4. RBBB   5. Abnormal ECG      PLAN:  In order of problems listed above:  1. Paroxysmal ventricular tachycardia s/p ablation with no reoccurrence 2. HTN - BP borderline controlled on current medical regimen.  Continue amlodipine/Tekturna/ARB and BB.  Her BP at home runs 136-160/60-71mmHg.  We cannot increase BB due to bradycardia.  She is on max dose of ARB and cannot increase amlodipine further due to history of LE edema on higher doses.  I will add Hydralazine 10mg  TID.  I will have her followup in our HTN clinic in 1 week.  I would like to wean her off of Tekturna as she is already on an ARB.  Will continue to titrate hydralazine while decreasing and ultimately stopping Tekturna. 3. Sinoatrial node dysfunction 4. RBBB 5. Abnormal EKG - new Q waves in III and aVF.  I will get a 2D echo to assess for wall motion abnormality.  She is asymptomatic.     Medication Adjustments/Labs and Tests Ordered: Current medicines are reviewed at length with the patient today.  Concerns regarding medicines are outlined above.  Medication changes, Labs and Tests ordered today are listed in the Patient Instructions below.  There are no Patient Instructions on file for this visit.   Signed, Fransico Him, MD  03/23/2016 10:55 AM    [facility] [address], [place], [place]  [address] Phone: [phone]; Fax: [phone]

## 2016-03-23 NOTE — Patient Instructions (Signed)
Medication Instructions:  1) START HYDRALAZINE 10 mg THREE TIMES DAILY  Labwork: None  Testing/Procedures: Your physician has requested that you have an echocardiogram. Echocardiography is a painless test that uses sound waves to create images of your heart. It provides your doctor with information about the size and shape of your heart and how well your heart's chambers and valves are working. This procedure takes approximately one hour. There are no restrictions for this procedure.  Follow-Up: Your physician recommends that you schedule a follow-up appointment IN ONE WEEK in the HTN Clinic.  Your physician wants you to follow-up in: 1 year with Dr. Radford Pax. You will receive a reminder letter in the mail two months in advance. If you don't receive a letter, please call our office to schedule the follow-up appointment.   Any Other Special Instructions Will Be Listed Below (If Applicable).     If you need a refill on your cardiac medications before your next appointment, please call your pharmacy.

## 2016-03-25 ENCOUNTER — Telehealth: Payer: Self-pay

## 2016-03-25 NOTE — Telephone Encounter (Signed)
Patient called about the start of hydralazine with tekturna. Spoke with Hulen Shouts RN to clarify for patient. Responded back to pt that she is to start hydralazine three times per day and that she will will f/u with HTN clinic 1 week from last ov 03/23/16.

## 2016-03-30 ENCOUNTER — Telehealth: Payer: Self-pay | Admitting: Cardiology

## 2016-03-30 NOTE — Telephone Encounter (Signed)
Pt states that she started Hydralazine 10mg  TID on Friday.   Since starting medication pt says she has been feeling lethargic/very tired/weak.  Denies lightheadedness, dizziness, CP and SOB.  Pt has been sleeping longer and states "if I sit down, I could fall asleep."  Pt has only checked BP once since starting medicine, which was this morning: 171/78, HR 66.  Pt does not want to take this medication anymore and would like to know what other options she has.  Will route to Dr. Radford Pax for review and advisement.

## 2016-03-30 NOTE — Telephone Encounter (Signed)
New message    Pt c/o medication issue:  1. Name of Medication: hydralazine  2. How are you currently taking this medication (dosage and times per day)? 10 mg po three times daily  3. Are you having a reaction (difficulty breathing--STAT)? yes  4. What is your medication issue? The pt is staying tied  Just not felling like herself and the pt states the blood pressure pill is not working   The pt states there is a follow-up with the hypertension clinic in June sometime. The pt also states this am when she took the medication she felt awful. And the pt states her family can tell the medication is not working right, she would like to speak the nurse or MD.

## 2016-03-31 NOTE — Telephone Encounter (Signed)
Patient st she feels better now. She is NOT taking her hydralazine because of the side effects. Patient was supposed to be seen 1 week after OV in HTN Clinic. Scheduled patient to be seen tomorrow in the HTN Clinic with Gay Filler. Patient was grateful for call.

## 2016-03-31 NOTE — Telephone Encounter (Signed)
Left message to call back  

## 2016-03-31 NOTE — Telephone Encounter (Signed)
Follow Up:   Pt says she is still waiting to hear something back from her call yesterday.

## 2016-04-01 ENCOUNTER — Ambulatory Visit (INDEPENDENT_AMBULATORY_CARE_PROVIDER_SITE_OTHER): Payer: Medicare Other | Admitting: Pharmacist

## 2016-04-01 VITALS — BP 130/78 | HR 63

## 2016-04-01 DIAGNOSIS — I10 Essential (primary) hypertension: Secondary | ICD-10-CM | POA: Diagnosis not present

## 2016-04-01 NOTE — Progress Notes (Signed)
Patient ID: [facility]                 DOB: 01/13/1925                      MRN: [id number]     HPI: [facility] is a 82 y.o. female referred by Dr. Radford Russell to HTN clinic. She was seen by Dr. Radford Russell on 03/23/16 and had an elevated BP.  Dr. Radford Russell added hydralazine 10mg  TID to her regimen.  Unfortunately pt called the clinic on 5/31 stating she felt awful while taking the hydralazine and stopped it.  She is here today for follow up.  Her PMH is significant for HTN, paroxysmal ventricular tachycardia, and RBBB.    Pt states she is feeling better now that she is not taking the hydralazine.  She has a nurse who will check her BP at [facility] on a regular basis.  Her BP on 6/1 was 0000000 systolic and her HR in the 40s she states.  She likes to call [facility] for the residents of [facility] once a week.   Current HTN meds: Tekturna 150mg  daily, amlodipine 2.5mg  daily, losartan 100mg  daily, metoprolol 50mg  BID Previously tried: hydralazine BP goal: <150/90    Wt Readings from Last 3 Encounters:  03/23/16 124 lb 1.9 oz (56.3 kg)  03/16/15 124 lb (56.246 kg)  02/06/14 120 lb (54.432 kg)   BP Readings from Last 3 Encounters:  04/01/16 130/78  03/23/16 152/72  03/16/15 130/68   Pulse Readings from Last 3 Encounters:  04/01/16 63  03/23/16 50  03/16/15 57    Renal function: CrCl cannot be calculated (Unknown ideal weight.).  Past Medical History  Diagnosis Date  . NSVT (nonsustained ventricular tachycardia) (HCC)     s/p Ablation of RV outflow VT 2003  . Dyslipidemia     goal /LDL cholestrol less then 160  . Mild aortic insufficiency   . Mild mitral regurgitation   . Hypothyroidism   . GERD (gastroesophageal reflux disease)   . Osteoporosis     restart fosamax 04/2012  . Hypertension   . RBBB   . Abnormal ECG 03/23/2016    Current Outpatient Prescriptions on File Prior to Visit  Medication Sig Dispense Refill  . alendronate (FOSAMAX) 70 MG tablet Take 70 mg by mouth once a  week. Take with a full glass of water on an empty stomach.    Marland Kitchen aliskiren (TEKTURNA) 150 MG tablet Take 150 mg by mouth daily.    Marland Kitchen amLODipine (NORVASC) 5 MG tablet Take 5 mg by mouth daily. Taking 1/2 tablet due to swelling in legs    . aspirin 81 MG tablet Take 81 mg by mouth daily.    Marland Kitchen atorvastatin (LIPITOR) 10 MG tablet Take 5 mg by mouth daily.    . hydrALAZINE (APRESOLINE) 10 MG tablet Take 1 tablet (10 mg total) by mouth 3 (three) times daily. 90 tablet 11  . losartan (COZAAR) 100 MG tablet Take 100 mg by mouth daily.    . Melatonin 5 MG TABS Take 0.5 tablets by mouth at bedtime as needed (sleep).     . metoprolol (LOPRESSOR) 50 MG tablet Take 50 mg by mouth 2 (two) times daily.    . Multiple Vitamin (MULTIVITAMIN) tablet Take 1 tablet by mouth daily.    . Omega-3 Fatty Acids (FISH OIL) 1000 MG CAPS Take 1 capsule by mouth daily.     Marland Kitchen SYNTHROID 112 MCG tablet Take 112  mcg by mouth daily.     No current facility-administered medications on file prior to visit.    Allergies  Allergen Reactions  . Adhesive [Tape]   . Augmentin [Amoxicillin-Pot Clavulanate] Diarrhea  . Sulfa Antibiotics Itching     Assessment/Plan: 1.  Hypertension- Pt's BP at goal today without hydralazine.  Given her side effects, okay not to restart for now.  There is some concern for her being on Tekturna and losartan combination as there is little data to support the efficacy of this combination versus the concern for side effects (AKI in particular).  Pt has been on this combination for several years.  She is hesitant to change medications but did state [facility] is her most expensive medication.  She has never had a echocardiogram done and has one scheduled for the middle of June.  Told her we could wait until after those results were available and then make a decision of which medication we should continue.  Would favor staying with ARB but will likely need a more potent BP lowering agent than losartan.     [facility], PharmD, BCPS, CPP

## 2016-04-01 NOTE — Patient Instructions (Signed)
Your blood pressure is great today.    We will wait until your echocardiogram in 2 weeks to change your medications.  We do not want you to take Tekturna and losartan together long term.

## 2016-04-11 ENCOUNTER — Other Ambulatory Visit (HOSPITAL_COMMUNITY): Payer: Medicare Other

## 2016-04-13 ENCOUNTER — Other Ambulatory Visit: Payer: Self-pay

## 2016-04-13 ENCOUNTER — Ambulatory Visit: Payer: Medicare Other | Admitting: Pharmacist

## 2016-04-13 ENCOUNTER — Ambulatory Visit (HOSPITAL_COMMUNITY): Payer: Medicare Other | Attending: Cardiovascular Disease

## 2016-04-13 DIAGNOSIS — I472 Ventricular tachycardia: Secondary | ICD-10-CM | POA: Diagnosis not present

## 2016-04-13 DIAGNOSIS — I351 Nonrheumatic aortic (valve) insufficiency: Secondary | ICD-10-CM | POA: Diagnosis not present

## 2016-04-13 DIAGNOSIS — R9431 Abnormal electrocardiogram [ECG] [EKG]: Secondary | ICD-10-CM | POA: Insufficient documentation

## 2016-04-13 DIAGNOSIS — I071 Rheumatic tricuspid insufficiency: Secondary | ICD-10-CM | POA: Insufficient documentation

## 2016-04-13 DIAGNOSIS — I119 Hypertensive heart disease without heart failure: Secondary | ICD-10-CM | POA: Diagnosis not present

## 2016-04-13 DIAGNOSIS — E785 Hyperlipidemia, unspecified: Secondary | ICD-10-CM | POA: Diagnosis not present

## 2016-04-13 DIAGNOSIS — I4510 Unspecified right bundle-branch block: Secondary | ICD-10-CM | POA: Diagnosis not present

## 2016-04-13 DIAGNOSIS — I340 Nonrheumatic mitral (valve) insufficiency: Secondary | ICD-10-CM | POA: Diagnosis not present

## 2016-04-13 DIAGNOSIS — I4729 Other ventricular tachycardia: Secondary | ICD-10-CM

## 2016-04-13 LAB — ECHOCARDIOGRAM COMPLETE
AVLVOTPG: 5 mmHg
Ao-asc: 33 cm
CHL CUP MV DEC (S): 370
EWDT: 370 ms
FS: 32 % (ref 28–44)
IV/PV OW: 0.96
LA ID, A-P, ES: 40 mm
LA vol A4C: 45 ml
LA vol index: 31.5 mL/m2
LA vol: 49 mL
LADIAMINDEX: 2.57 cm/m2
LDCA: 2.01 cm2
LEFT ATRIUM END SYS DIAM: 40 mm
LV PW d: 11.5 mm — AB (ref 0.6–1.1)
LVOT SV: 46 mL
LVOT VTI: 22.9 cm
LVOT peak vel: 111 cm/s
LVOTD: 16 mm
MV Peak grad: 2 mmHg
MV pk E vel: 73.5 m/s
MVPKAVEL: 115 m/s
RV sys press: 17 mmHg
Reg peak vel: 186 cm/s
TR max vel: 186 cm/s

## 2016-04-14 ENCOUNTER — Telehealth: Payer: Self-pay | Admitting: Cardiology

## 2016-04-14 ENCOUNTER — Encounter: Payer: Self-pay | Admitting: Cardiology

## 2016-04-14 NOTE — Addendum Note (Signed)
Addended by: Elberta Leatherwood R on: 04/14/2016 05:11 PM   Modules accepted: Orders, Medications

## 2016-04-14 NOTE — Telephone Encounter (Signed)
Pt had forgotten that she was told that there may be some changes made to her medications based on her echo. Pt was seen in HTN clinic by Elberta Leatherwood, [facility] and wanted to wait for echo before making med changes. Advised pt that I would send this message to [facility] for review and advisement. Pt verbalized understanding and was appreciative for call.

## 2016-04-14 NOTE — Telephone Encounter (Signed)
New message      Pt forgot to ask if all of her medication dosages stay the same.  A bp pill was recently added but pt stopped taking it.

## 2016-04-14 NOTE — Telephone Encounter (Signed)
Spoke with pt.  Her echo showed normal EF.  Okay to stay on either Tekturna or Losartan.  Pt did mention the cost of Tekturna so we will discontinue this and continue losartan only.  Her BP was okay at her last visit but concern it may increase with change in medication.  She has it checked by RN at her facility each Thursday.  She will write these down and call us with the results.  If  >150/90, will have to consider additional therapy or changing to a more potent ARB.

## 2016-04-15 DIAGNOSIS — M25511 Pain in right shoulder: Secondary | ICD-10-CM | POA: Diagnosis not present

## 2016-06-29 DIAGNOSIS — Z0000 Encounter for general adult medical examination without abnormal findings: Secondary | ICD-10-CM | POA: Diagnosis not present

## 2016-06-29 DIAGNOSIS — I351 Nonrheumatic aortic (valve) insufficiency: Secondary | ICD-10-CM | POA: Diagnosis not present

## 2016-06-29 DIAGNOSIS — H6123 Impacted cerumen, bilateral: Secondary | ICD-10-CM | POA: Diagnosis not present

## 2016-06-29 DIAGNOSIS — Z1389 Encounter for screening for other disorder: Secondary | ICD-10-CM | POA: Diagnosis not present

## 2016-06-29 DIAGNOSIS — Z79899 Other long term (current) drug therapy: Secondary | ICD-10-CM | POA: Diagnosis not present

## 2016-06-29 DIAGNOSIS — E7800 Pure hypercholesterolemia, unspecified: Secondary | ICD-10-CM | POA: Diagnosis not present

## 2016-06-29 DIAGNOSIS — I340 Nonrheumatic mitral (valve) insufficiency: Secondary | ICD-10-CM | POA: Diagnosis not present

## 2016-06-29 DIAGNOSIS — E039 Hypothyroidism, unspecified: Secondary | ICD-10-CM | POA: Diagnosis not present

## 2016-06-29 DIAGNOSIS — I10 Essential (primary) hypertension: Secondary | ICD-10-CM | POA: Diagnosis not present

## 2016-07-18 DIAGNOSIS — H6123 Impacted cerumen, bilateral: Secondary | ICD-10-CM | POA: Diagnosis not present

## 2016-07-18 DIAGNOSIS — H9193 Unspecified hearing loss, bilateral: Secondary | ICD-10-CM | POA: Diagnosis not present

## 2016-08-02 DIAGNOSIS — Z23 Encounter for immunization: Secondary | ICD-10-CM | POA: Diagnosis not present

## 2016-09-04 IMAGING — CT CT HIP*L* W/O CM
2 of 3 series · 17 of 46 positions shown, 19 images · non-contrast
Comparison: Radiographs 07/31/2014.

CLINICAL DATA: Left hip pain after falling 6 days ago. Subsequent
encounter.

EXAM:
CT OF THE LEFT HIP WITHOUT CONTRAST
TECHNIQUE: Multidetector CT imaging of the left hip was performed according to
the standard protocol. Multiplanar CT image reconstructions were
also generated.

[Series 5: pelvis 3.0 i40f 1 · axial · 0.57mm/px · z∈[+544,+787]mm · 14 of 93 slices shown, 16 images]
[im 6/93  soft-tissue]
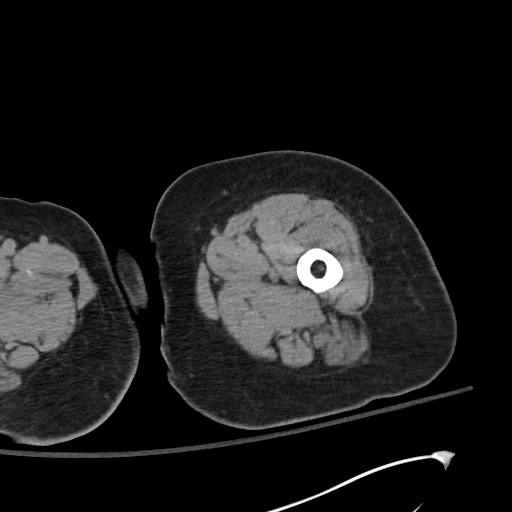
[im 6/93  bone]
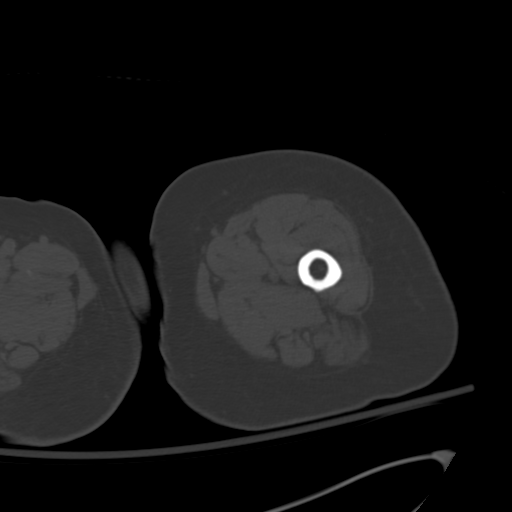
[im 12/93  soft-tissue]
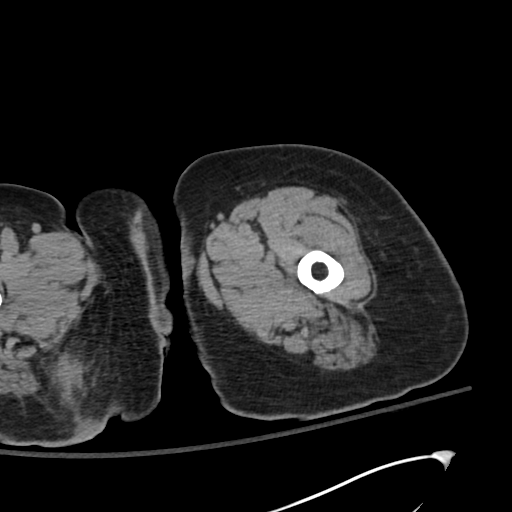
[im 18/93  soft-tissue]
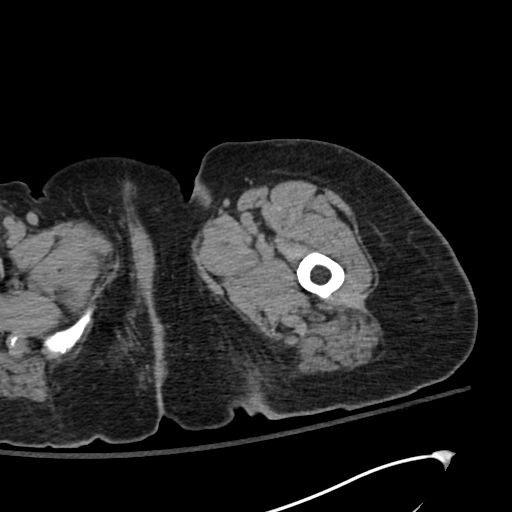
[im 24/93  soft-tissue]
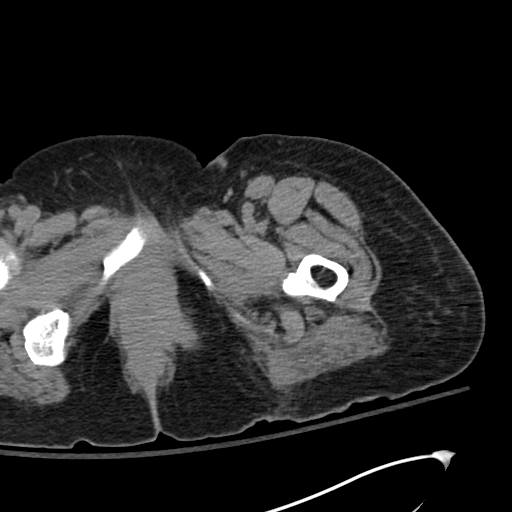
[im 30/93  soft-tissue]
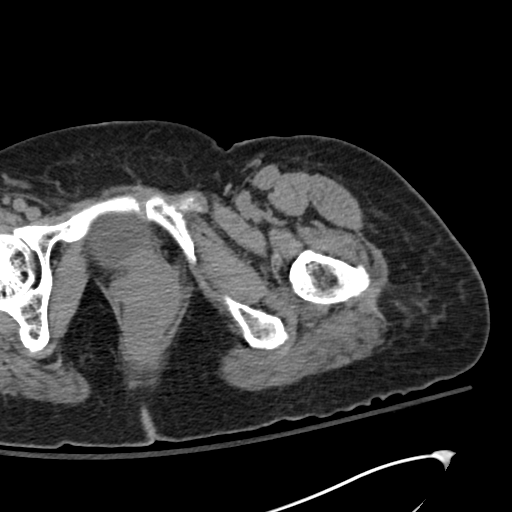
[im 36/93  soft-tissue]
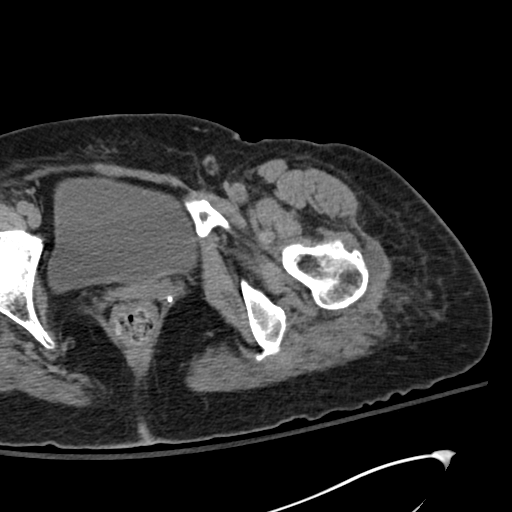
[im 42/93  soft-tissue]
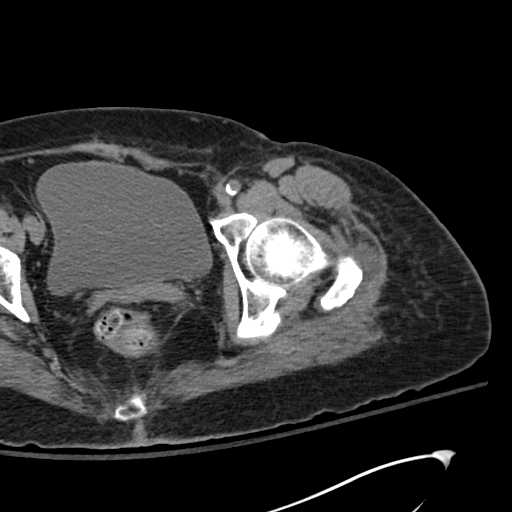
[im 51/93  soft-tissue]
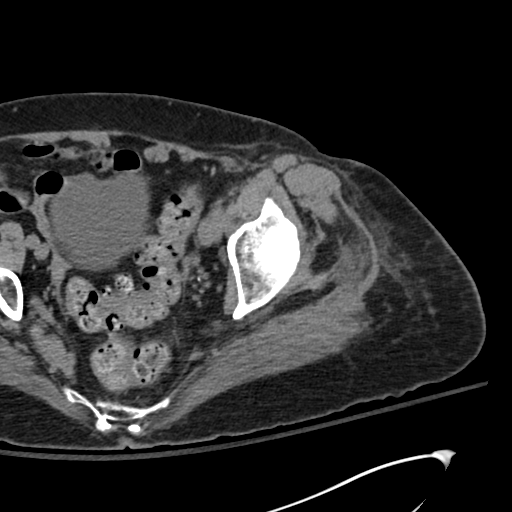
[im 57/93  soft-tissue]
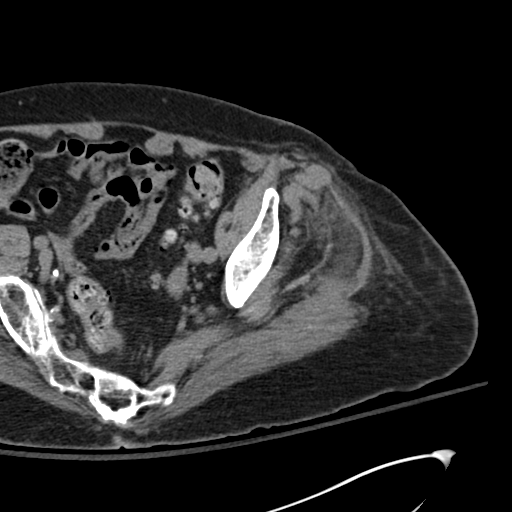
[im 57/93  bone]
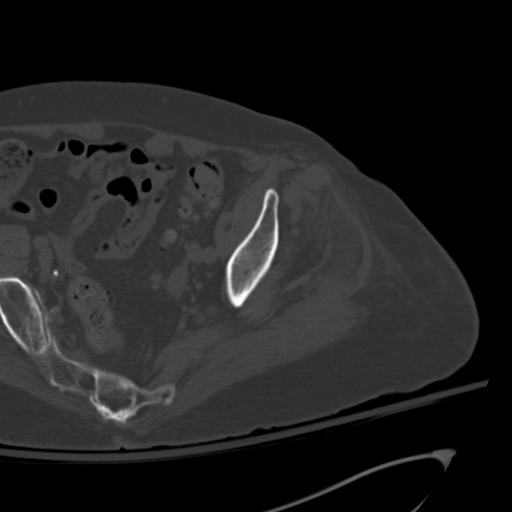
[im 63/93  soft-tissue]
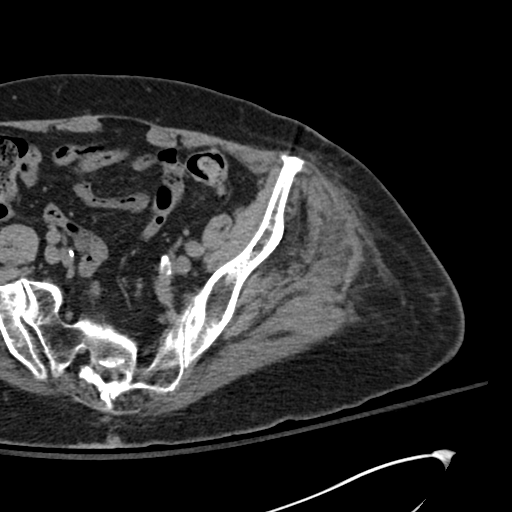
[im 69/93  soft-tissue]
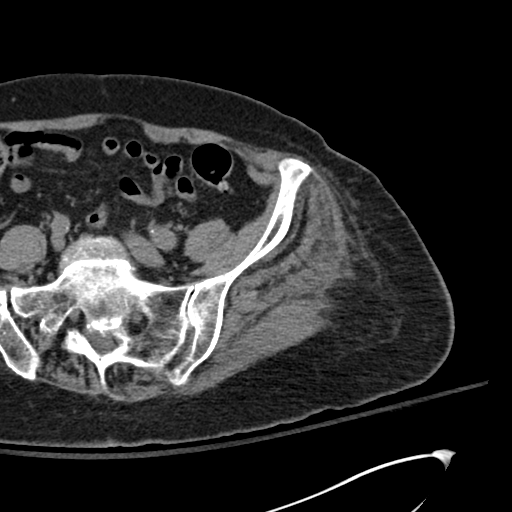
[im 75/93  soft-tissue]
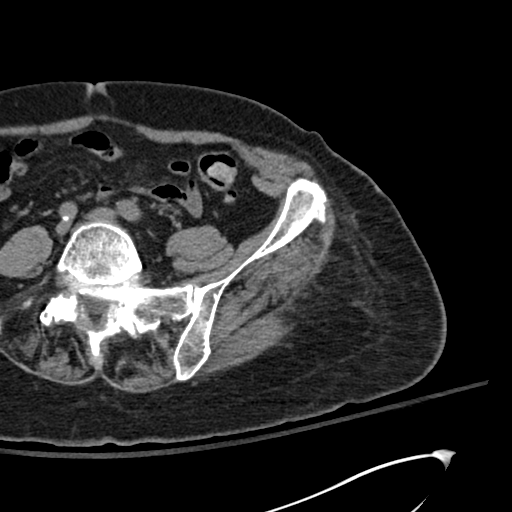
[im 81/93  soft-tissue]
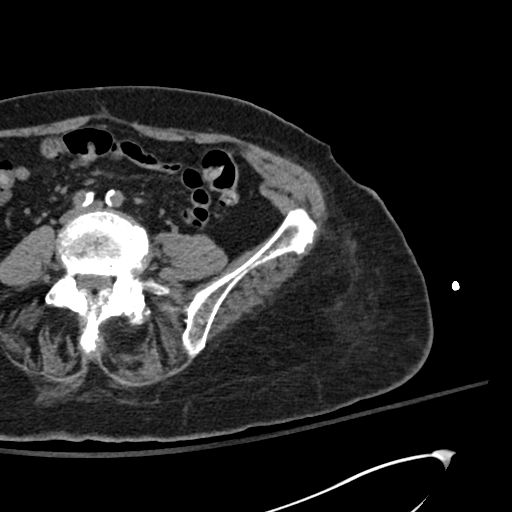
[im 87/93  soft-tissue]
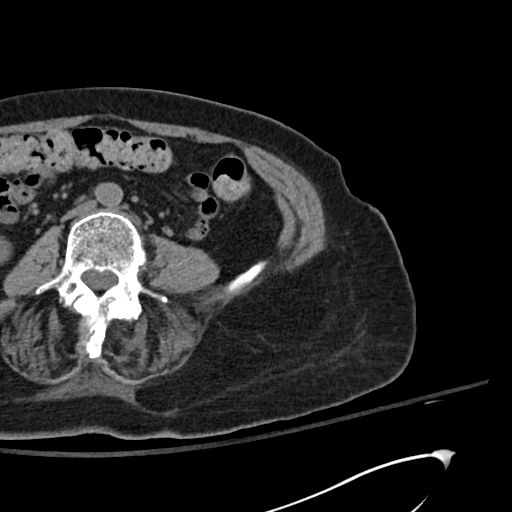

[Series 603: coronal st · coronal · 0.57mm/px · 3 of 93 slices shown]
[im 31/93  soft-tissue]
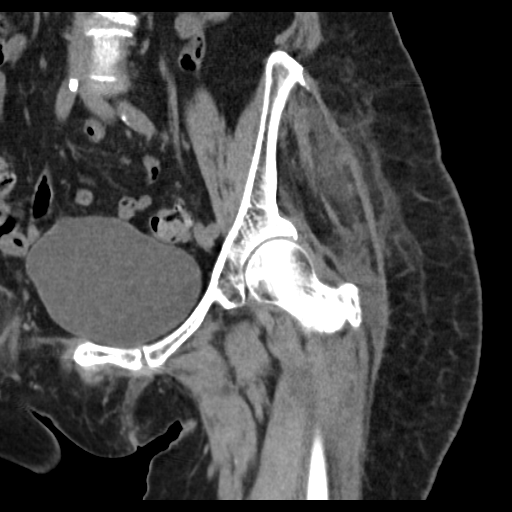
[im 41/93  soft-tissue]
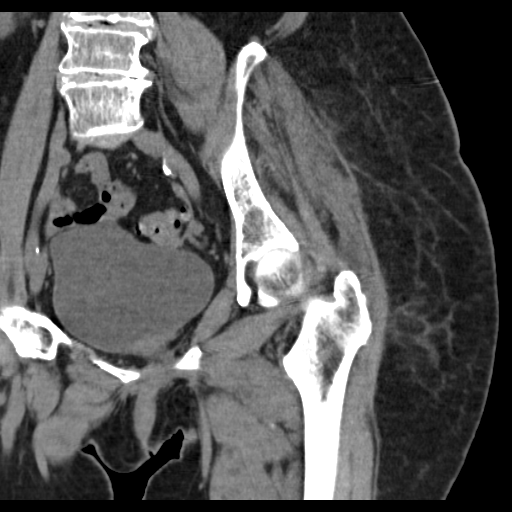
[im 52/93  soft-tissue]
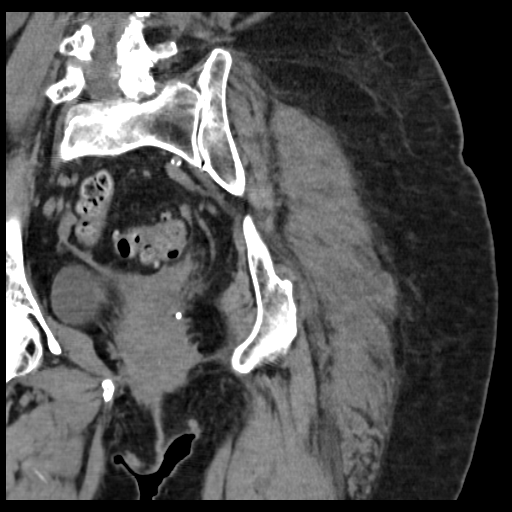

[17 of 46 positions shown; findings below may reference images not displayed]

FINDINGS: Examination includes the left hemipelvis and left hip. The pelvis is
incompletely visualized. There is no evidence of acute fracture or
dislocation. The left femoral head appears normal without evidence
of avascular necrosis. There are mild degenerative changes at both
sacroiliac joints. More advanced degenerative changes are present
within the lower lumbar spine. There is a grade 1 anterolisthesis at
L5-S1 resulting in biforaminal stenosis.

There is some subcutaneous edema lateral to the left hip. No focal
fluid collection is seen. There is atrophy within the left gluteus
minimus and medius muscles.

The visualized pelvis demonstrates aortoiliac atherosclerosis and
sigmoid diverticulosis without surrounding inflammation.
IMPRESSION: 1. No evidence of left hip fracture, dislocation or acute pelvic
injury.
2. Subcutaneous edema/hemorrhage lateral to the left hip. No focal
hematoma.
3. Lower lumbar spondylosis with grade 1 anterolisthesis at L5-S1.

## 2016-09-26 DIAGNOSIS — Z85828 Personal history of other malignant neoplasm of skin: Secondary | ICD-10-CM | POA: Diagnosis not present

## 2016-09-26 DIAGNOSIS — L720 Epidermal cyst: Secondary | ICD-10-CM | POA: Diagnosis not present

## 2016-09-26 DIAGNOSIS — L821 Other seborrheic keratosis: Secondary | ICD-10-CM | POA: Diagnosis not present

## 2016-10-17 DIAGNOSIS — Z0100 Encounter for examination of eyes and vision without abnormal findings: Secondary | ICD-10-CM | POA: Diagnosis not present

## 2016-10-17 DIAGNOSIS — H04123 Dry eye syndrome of bilateral lacrimal glands: Secondary | ICD-10-CM | POA: Diagnosis not present

## 2016-10-17 DIAGNOSIS — H26493 Other secondary cataract, bilateral: Secondary | ICD-10-CM | POA: Diagnosis not present

## 2016-10-17 DIAGNOSIS — H43813 Vitreous degeneration, bilateral: Secondary | ICD-10-CM | POA: Diagnosis not present

## 2016-12-28 DIAGNOSIS — I10 Essential (primary) hypertension: Secondary | ICD-10-CM | POA: Diagnosis not present

## 2016-12-28 DIAGNOSIS — Z79899 Other long term (current) drug therapy: Secondary | ICD-10-CM | POA: Diagnosis not present

## 2016-12-28 DIAGNOSIS — L729 Follicular cyst of the skin and subcutaneous tissue, unspecified: Secondary | ICD-10-CM | POA: Diagnosis not present

## 2016-12-28 DIAGNOSIS — M25512 Pain in left shoulder: Secondary | ICD-10-CM | POA: Diagnosis not present

## 2017-01-02 DIAGNOSIS — Z79899 Other long term (current) drug therapy: Secondary | ICD-10-CM | POA: Diagnosis not present

## 2017-03-08 ENCOUNTER — Encounter: Payer: Self-pay | Admitting: Cardiology

## 2017-03-08 DIAGNOSIS — D481 Neoplasm of uncertain behavior of connective and other soft tissue: Secondary | ICD-10-CM | POA: Diagnosis not present

## 2017-03-13 DIAGNOSIS — M19012 Primary osteoarthritis, left shoulder: Secondary | ICD-10-CM | POA: Diagnosis not present

## 2017-03-29 ENCOUNTER — Encounter: Payer: Self-pay | Admitting: Cardiology

## 2017-03-29 ENCOUNTER — Ambulatory Visit (INDEPENDENT_AMBULATORY_CARE_PROVIDER_SITE_OTHER): Payer: PRIVATE HEALTH INSURANCE | Admitting: Cardiology

## 2017-03-29 VITALS — BP 122/60 | HR 64 | Ht 60.0 in | Wt 125.8 lb

## 2017-03-29 DIAGNOSIS — I495 Sick sinus syndrome: Secondary | ICD-10-CM

## 2017-03-29 DIAGNOSIS — I351 Nonrheumatic aortic (valve) insufficiency: Secondary | ICD-10-CM

## 2017-03-29 DIAGNOSIS — I4510 Unspecified right bundle-branch block: Secondary | ICD-10-CM

## 2017-03-29 DIAGNOSIS — I472 Ventricular tachycardia: Secondary | ICD-10-CM

## 2017-03-29 DIAGNOSIS — I340 Nonrheumatic mitral (valve) insufficiency: Secondary | ICD-10-CM

## 2017-03-29 DIAGNOSIS — I4729 Other ventricular tachycardia: Secondary | ICD-10-CM

## 2017-03-29 DIAGNOSIS — I10 Essential (primary) hypertension: Secondary | ICD-10-CM

## 2017-03-29 NOTE — Patient Instructions (Signed)

## 2017-03-29 NOTE — Addendum Note (Signed)
Addended by: Harland German A on: 03/29/2017 11:03 AM   Modules accepted: Orders

## 2017-03-29 NOTE — Progress Notes (Signed)
Cardiology Office Note    Date:  03/29/2017   ID:  [facility], DOB 12/30/1924, MRN [id number]  PCP:  Lajean Manes, MD  Cardiologist:  Fransico Him, MD   Chief Complaint  Patient presents with  . Follow-up    ventricular tachycardia, HTN, mild to moderate MR    History of Present Illness:  [facility] is a 83 y.o. female with a history of NSVT s/p ablation of RV outflow VT 2003, HTN, mild AR/MR and RBBB.  She presents today for followup and  is doing well. She denies any chest pain or pressure, SOB, DOE, PND, orthopnea, LE edema (except with prolonged sitting) ,dizziness, palpitations or syncope    Past Medical History:  Diagnosis Date  . Abnormal ECG 03/23/2016   normal LVF on echo 03/2016  . Dyslipidemia    goal /LDL cholestrol less then 160  . GERD (gastroesophageal reflux disease)   . Hypertension   . Hypothyroidism   . Mitral regurgitation    mild to moderate by echo 03/2016  . NSVT (nonsustained ventricular tachycardia) (HCC)    s/p Ablation of RV outflow VT 2003  . Osteoporosis    restart fosamax 04/2012  . RBBB     History reviewed. No pertinent surgical history.  Current Medications: No outpatient prescriptions have been marked as taking for the 03/29/17 encounter (Office Visit) with Sueanne Margarita, MD.    Allergies:   Adhesive [tape]; Augmentin [amoxicillin-pot clavulanate]; Other; Sulfa antibiotics; Sulfasalazine; and Latex   Social History   Social History  . Marital status: Widowed    Spouse name: N/A  . Number of children: 2  . Years of education: college   Occupational History  . retired    Social History Main Topics  . Smoking status: Never Smoker  . Smokeless tobacco: Never Used  . Alcohol use 0.0 oz/week     Comment: occassionally  . Drug use: No  . Sexual activity: Not Asked   Other Topics Concern  . None   Social History Narrative  . None     Family History:  The patient's family history includes Diabetes in her  mother; Heart Problems in her brother.   ROS:   Please see the history of present illness.    ROS All other systems reviewed and are negative.  No flowsheet data found.     PHYSICAL EXAM:   VS:  BP 122/60   Pulse 64   Ht 5' (1.524 m)   Wt 125 lb 12.8 oz (57.1 kg)   BMI 24.57 kg/m    GEN: Well nourished, well developed, in no acute distress  HEENT: normal  Neck: no JVD, carotid bruits, or masses Cardiac: RRR; no murmurs, rubs, or gallops,no edema.  Intact distal pulses bilaterally.  Respiratory:  clear to auscultation bilaterally, normal work of breathing GI: soft, nontender, nondistended, + BS MS: no deformity or atrophy  Skin: warm and dry, no rash Neuro:  Alert and Oriented x 3, Strength and sensation are intact Psych: euthymic mood, full affect  Wt Readings from Last 3 Encounters:  03/29/17 125 lb 12.8 oz (57.1 kg)  03/23/16 124 lb 1.9 oz (56.3 kg)  03/16/15 124 lb (56.2 kg)      Studies/Labs Reviewed:   EKG:  EKG is ordered today.  The ekg ordered today demonstrates NSR at 62bpm with RBBB  Recent Labs: No results found for requested labs within last 8760 hours.   Lipid Panel No results found for: CHOL, TRIG,  HDL, CHOLHDL, VLDL, LDLCALC, LDLDIRECT  Additional studies/ records that were reviewed today include:  none    ASSESSMENT:    1. Paroxysmal ventricular tachycardia ([place])   2. Benign essential HTN   3. Sinoatrial node dysfunction (HCC)   4. Mild aortic insufficiency   5. RBBB      PLAN:  In order of problems listed above:  1. Paroxysmal ventricular tachycardia s/p ablation.  She will continue on Lopressor. 2. HTN - Her BP is well controlled on current meds.  She will continue on amlodipine 5mg  daily, Hydralazine 10mg  TID, Losartan 100mg  daily, Metoprolol 50mg  BID 3. Sinoatrial node dysfunction - she is asymptomatic.  HR stable on BB for HTN and NSVT. 4. Mild to moderate  MR by echo 03/2016.  I will repeat an echo to make sure this has not  progressed. 5. RBBB - she is asymptomatic.     Medication Adjustments/Labs and Tests Ordered: Current medicines are reviewed at length with the patient today.  Concerns regarding medicines are outlined above.  Medication changes, Labs and Tests ordered today are listed in the Patient Instructions below.  There are no Patient Instructions on file for this visit.   Signed, Fransico Him, MD  03/29/2017 10:46 AM    [address], [place], [place]  [address] Phone: [phone]; Fax: [phone]

## 2017-03-31 NOTE — Addendum Note (Signed)
Addended by: Patterson Hammersmith A on: 03/31/2017 10:30 AM   Modules accepted: Orders

## 2017-04-12 ENCOUNTER — Ambulatory Visit (HOSPITAL_COMMUNITY): Payer: PPO | Attending: Cardiology

## 2017-04-12 ENCOUNTER — Other Ambulatory Visit: Payer: Self-pay

## 2017-04-12 DIAGNOSIS — I351 Nonrheumatic aortic (valve) insufficiency: Secondary | ICD-10-CM | POA: Insufficient documentation

## 2017-04-12 DIAGNOSIS — I340 Nonrheumatic mitral (valve) insufficiency: Secondary | ICD-10-CM

## 2017-04-14 ENCOUNTER — Telehealth: Payer: Self-pay

## 2017-04-14 DIAGNOSIS — I340 Nonrheumatic mitral (valve) insufficiency: Secondary | ICD-10-CM

## 2017-04-14 NOTE — Telephone Encounter (Signed)
Informed patient of results and verbal understanding expressed.  Repeat ECHO ordered to be scheduled in 1 year. Patient agrees with treatment plan. 

## 2017-04-14 NOTE — Telephone Encounter (Signed)
-----   Message from Sueanne Margarita, MD sent at 04/12/2017  2:41 PM EDT ----- Echo showed normal LVF with EF 60-65% with mild LVH, increased stiffness of heart muscle normal for age, mild AS and moderate MR - repeat echo in 1 year for MR

## 2017-05-08 DIAGNOSIS — K641 Second degree hemorrhoids: Secondary | ICD-10-CM | POA: Diagnosis not present

## 2017-05-08 DIAGNOSIS — K625 Hemorrhage of anus and rectum: Secondary | ICD-10-CM | POA: Diagnosis not present

## 2017-05-08 DIAGNOSIS — H6123 Impacted cerumen, bilateral: Secondary | ICD-10-CM | POA: Diagnosis not present

## 2017-05-10 DIAGNOSIS — H6123 Impacted cerumen, bilateral: Secondary | ICD-10-CM | POA: Diagnosis not present

## 2017-06-30 DIAGNOSIS — Z85828 Personal history of other malignant neoplasm of skin: Secondary | ICD-10-CM | POA: Diagnosis not present

## 2017-06-30 DIAGNOSIS — L720 Epidermal cyst: Secondary | ICD-10-CM | POA: Diagnosis not present

## 2017-07-05 DIAGNOSIS — E039 Hypothyroidism, unspecified: Secondary | ICD-10-CM | POA: Diagnosis not present

## 2017-07-05 DIAGNOSIS — I10 Essential (primary) hypertension: Secondary | ICD-10-CM | POA: Diagnosis not present

## 2017-07-05 DIAGNOSIS — Z1389 Encounter for screening for other disorder: Secondary | ICD-10-CM | POA: Diagnosis not present

## 2017-07-05 DIAGNOSIS — Z0000 Encounter for general adult medical examination without abnormal findings: Secondary | ICD-10-CM | POA: Diagnosis not present

## 2017-07-05 DIAGNOSIS — I340 Nonrheumatic mitral (valve) insufficiency: Secondary | ICD-10-CM | POA: Diagnosis not present

## 2017-07-05 DIAGNOSIS — Z23 Encounter for immunization: Secondary | ICD-10-CM | POA: Diagnosis not present

## 2017-07-05 DIAGNOSIS — L729 Follicular cyst of the skin and subcutaneous tissue, unspecified: Secondary | ICD-10-CM | POA: Diagnosis not present

## 2017-07-05 DIAGNOSIS — Z79899 Other long term (current) drug therapy: Secondary | ICD-10-CM | POA: Diagnosis not present

## 2017-07-05 DIAGNOSIS — E7800 Pure hypercholesterolemia, unspecified: Secondary | ICD-10-CM | POA: Diagnosis not present

## 2017-07-06 DIAGNOSIS — L720 Epidermal cyst: Secondary | ICD-10-CM | POA: Diagnosis not present

## 2017-07-06 DIAGNOSIS — Z85828 Personal history of other malignant neoplasm of skin: Secondary | ICD-10-CM | POA: Diagnosis not present
